# Patient Record
Sex: Male | Born: 1950 | Race: Black or African American | Hispanic: No | Marital: Married | Smoking: Former smoker
Health system: Southern US, Community
[De-identification: ages and names within clinical notes are randomized; demographics above are authoritative.]

## PROBLEM LIST (undated history)

## (undated) DIAGNOSIS — K5190 Ulcerative colitis, unspecified, without complications: Secondary | ICD-10-CM

## (undated) DIAGNOSIS — I10 Essential (primary) hypertension: Secondary | ICD-10-CM

## (undated) DIAGNOSIS — F329 Major depressive disorder, single episode, unspecified: Secondary | ICD-10-CM

## (undated) DIAGNOSIS — D649 Anemia, unspecified: Secondary | ICD-10-CM

## (undated) DIAGNOSIS — M25519 Pain in unspecified shoulder: Secondary | ICD-10-CM

## (undated) DIAGNOSIS — E785 Hyperlipidemia, unspecified: Secondary | ICD-10-CM

## (undated) DIAGNOSIS — G479 Sleep disorder, unspecified: Secondary | ICD-10-CM

## (undated) DIAGNOSIS — T8859XA Other complications of anesthesia, initial encounter: Secondary | ICD-10-CM

## (undated) DIAGNOSIS — F32A Depression, unspecified: Secondary | ICD-10-CM

## (undated) DIAGNOSIS — M1990 Unspecified osteoarthritis, unspecified site: Secondary | ICD-10-CM

## (undated) DIAGNOSIS — M2540 Effusion, unspecified joint: Secondary | ICD-10-CM

## (undated) DIAGNOSIS — H409 Unspecified glaucoma: Secondary | ICD-10-CM

## (undated) DIAGNOSIS — T4145XA Adverse effect of unspecified anesthetic, initial encounter: Secondary | ICD-10-CM

## (undated) DIAGNOSIS — M2550 Pain in unspecified joint: Secondary | ICD-10-CM

## (undated) HISTORY — PX: INGUINAL HERNIA REPAIR: SUR1180

## (undated) HISTORY — PX: TOTAL KNEE ARTHROPLASTY: SHX125

## (undated) HISTORY — PX: ESOPHAGOGASTRODUODENOSCOPY: SHX1529

## (undated) HISTORY — PX: SHOULDER OPEN ROTATOR CUFF REPAIR: SHX2407

## (undated) HISTORY — PX: COLONOSCOPY: SHX174

## (undated) HISTORY — PX: KNEE ARTHROSCOPY: SUR90

---

## 1988-12-06 HISTORY — PX: FOOT SURGERY: SHX648

## 1998-05-02 ENCOUNTER — Other Ambulatory Visit: Admission: RE | Admit: 1998-05-02 | Discharge: 1998-05-02 | Payer: Self-pay | Admitting: Podiatry

## 1999-08-13 ENCOUNTER — Ambulatory Visit (HOSPITAL_COMMUNITY): Admission: RE | Admit: 1999-08-13 | Discharge: 1999-08-13 | Payer: Self-pay | Admitting: Gastroenterology

## 1999-08-13 ENCOUNTER — Encounter (INDEPENDENT_AMBULATORY_CARE_PROVIDER_SITE_OTHER): Payer: Self-pay | Admitting: Specialist

## 2004-05-28 ENCOUNTER — Encounter (INDEPENDENT_AMBULATORY_CARE_PROVIDER_SITE_OTHER): Payer: Self-pay | Admitting: *Deleted

## 2004-05-28 ENCOUNTER — Ambulatory Visit (HOSPITAL_COMMUNITY): Admission: RE | Admit: 2004-05-28 | Discharge: 2004-05-28 | Payer: Self-pay | Admitting: Gastroenterology

## 2007-04-15 ENCOUNTER — Inpatient Hospital Stay (HOSPITAL_COMMUNITY): Admission: EM | Admit: 2007-04-15 | Discharge: 2007-04-18 | Payer: Self-pay | Admitting: Emergency Medicine

## 2007-11-08 ENCOUNTER — Inpatient Hospital Stay (HOSPITAL_COMMUNITY): Admission: AD | Admit: 2007-11-08 | Discharge: 2007-11-17 | Payer: Self-pay | Admitting: Gastroenterology

## 2007-11-12 ENCOUNTER — Encounter (INDEPENDENT_AMBULATORY_CARE_PROVIDER_SITE_OTHER): Payer: Self-pay | Admitting: Gastroenterology

## 2008-11-01 ENCOUNTER — Ambulatory Visit (HOSPITAL_COMMUNITY): Admission: RE | Admit: 2008-11-01 | Discharge: 2008-11-02 | Payer: Self-pay | Admitting: Orthopedic Surgery

## 2009-04-07 HISTORY — PX: CARDIAC CATHETERIZATION: SHX172

## 2010-01-22 ENCOUNTER — Inpatient Hospital Stay (HOSPITAL_COMMUNITY): Admission: AD | Admit: 2010-01-22 | Discharge: 2010-01-30 | Payer: Self-pay | Admitting: Gastroenterology

## 2010-01-23 ENCOUNTER — Encounter (INDEPENDENT_AMBULATORY_CARE_PROVIDER_SITE_OTHER): Payer: Self-pay | Admitting: Gastroenterology

## 2010-01-28 ENCOUNTER — Encounter (INDEPENDENT_AMBULATORY_CARE_PROVIDER_SITE_OTHER): Payer: Self-pay | Admitting: Cardiology

## 2010-02-02 ENCOUNTER — Emergency Department (HOSPITAL_COMMUNITY): Admission: EM | Admit: 2010-02-02 | Discharge: 2010-02-02 | Payer: Self-pay | Admitting: Emergency Medicine

## 2010-06-19 LAB — CARDIAC PANEL(CRET KIN+CKTOT+MB+TROPI)
CK, MB: 1.5 ng/mL (ref 0.3–4.0)
CK, MB: 2 ng/mL (ref 0.3–4.0)
Relative Index: INVALID (ref 0.0–2.5)
Relative Index: INVALID (ref 0.0–2.5)
Total CK: 20 U/L (ref 7–232)
Total CK: 37 U/L (ref 7–232)
Total CK: 43 U/L (ref 7–232)
Troponin I: 0.16 ng/mL — ABNORMAL HIGH (ref 0.00–0.06)

## 2010-06-19 LAB — CBC
HCT: 31.7 % — ABNORMAL LOW (ref 39.0–52.0)
HCT: 35.5 % — ABNORMAL LOW (ref 39.0–52.0)
HCT: 39 % (ref 39.0–52.0)
Hemoglobin: 10.4 g/dL — ABNORMAL LOW (ref 13.0–17.0)
Hemoglobin: 11.5 g/dL — ABNORMAL LOW (ref 13.0–17.0)
Hemoglobin: 13 g/dL (ref 13.0–17.0)
MCH: 27.6 pg (ref 26.0–34.0)
MCHC: 33.3 g/dL (ref 30.0–36.0)
MCV: 85.1 fL (ref 78.0–100.0)
MCV: 85.3 fL (ref 78.0–100.0)
MCV: 86.5 fL (ref 78.0–100.0)
Platelets: 354 10*3/uL (ref 150–400)
Platelets: 412 10*3/uL — ABNORMAL HIGH (ref 150–400)
RBC: 3.63 MIL/uL — ABNORMAL LOW (ref 4.22–5.81)
RBC: 4.17 MIL/uL — ABNORMAL LOW (ref 4.22–5.81)
RDW: 13.9 % (ref 11.5–15.5)
RDW: 14.5 % (ref 11.5–15.5)
WBC: 10.3 10*3/uL (ref 4.0–10.5)
WBC: 10.4 10*3/uL (ref 4.0–10.5)
WBC: 16.1 10*3/uL — ABNORMAL HIGH (ref 4.0–10.5)
WBC: 8.8 10*3/uL (ref 4.0–10.5)

## 2010-06-19 LAB — BLOOD GAS, ARTERIAL
Bicarbonate: 28.1 mEq/L — ABNORMAL HIGH (ref 20.0–24.0)
O2 Content: 2 L/min
O2 Saturation: 97.3 %
Patient temperature: 98.6

## 2010-06-19 LAB — COMPREHENSIVE METABOLIC PANEL
ALT: 199 U/L — ABNORMAL HIGH (ref 0–53)
AST: 16 U/L (ref 0–37)
AST: 86 U/L — ABNORMAL HIGH (ref 0–37)
Alkaline Phosphatase: 96 U/L (ref 39–117)
BUN: 13 mg/dL (ref 6–23)
CO2: 25 mEq/L (ref 19–32)
CO2: 32 mEq/L (ref 19–32)
Calcium: 9.3 mg/dL (ref 8.4–10.5)
Chloride: 100 mEq/L (ref 96–112)
Creatinine, Ser: 0.78 mg/dL (ref 0.4–1.5)
GFR calc Af Amer: >60 mL/min (ref >60)
GFR calc non Af Amer: >60 mL/min (ref >60)
GFR calc non Af Amer: >60 mL/min (ref >60)
Glucose, Bld: 104 mg/dL — ABNORMAL HIGH (ref 70–99)
Glucose, Bld: 155 mg/dL — ABNORMAL HIGH (ref 70–99)
Potassium: 3.9 mEq/L (ref 3.5–5.1)
Sodium: 133 mEq/L — ABNORMAL LOW (ref 135–145)
Total Bilirubin: 0.6 mg/dL (ref 0.3–1.2)

## 2010-06-19 LAB — POCT CARDIAC MARKERS
Myoglobin, poc: 30 ng/mL (ref 12–200)
Myoglobin, poc: 34 ng/mL (ref 12–200)

## 2010-06-19 LAB — CLOSTRIDIUM DIFFICILE EIA

## 2010-06-19 LAB — DIFFERENTIAL
Basophils Relative: 0 % (ref 0–1)
Eosinophils Absolute: 0.1 10*3/uL (ref 0.0–0.7)
Neutrophils Relative %: 73 % (ref 43–77)

## 2010-06-19 LAB — BASIC METABOLIC PANEL
BUN: 12 mg/dL (ref 6–23)
Chloride: 97 mEq/L (ref 96–112)
GFR calc Af Amer: >60 mL/min (ref >60)
GFR calc Af Amer: >60 mL/min (ref >60)
GFR calc non Af Amer: >60 mL/min (ref >60)
Potassium: 3.5 mEq/L (ref 3.5–5.1)
Potassium: 4.3 mEq/L (ref 3.5–5.1)
Sodium: 136 mEq/L (ref 135–145)

## 2010-06-19 LAB — LIPASE, BLOOD: Lipase: 51 U/L (ref 11–59)

## 2010-06-19 LAB — HEPATITIS C ANTIBODY: HCV Ab: NEGATIVE

## 2010-06-19 LAB — HEPATITIS B SURFACE ANTIBODY,QUALITATIVE: Hep B S Ab: NEGATIVE

## 2010-06-19 LAB — STOOL CULTURE

## 2010-07-14 LAB — COMPREHENSIVE METABOLIC PANEL
AST: 19 U/L (ref 0–37)
Alkaline Phosphatase: 57 U/L (ref 39–117)
CO2: 27 mEq/L (ref 19–32)
Chloride: 106 mEq/L (ref 96–112)
Creatinine, Ser: 0.79 mg/dL (ref 0.4–1.5)
GFR calc Af Amer: >60 mL/min (ref >60)
GFR calc non Af Amer: >60 mL/min (ref >60)
Potassium: 3.9 mEq/L (ref 3.5–5.1)
Total Bilirubin: 0.3 mg/dL (ref 0.3–1.2)

## 2010-07-14 LAB — URINALYSIS, ROUTINE W REFLEX MICROSCOPIC
Bilirubin Urine: NEGATIVE
Glucose, UA: NEGATIVE mg/dL
Hgb urine dipstick: NEGATIVE
Ketones, ur: NEGATIVE mg/dL
Protein, ur: NEGATIVE mg/dL
pH: 5.5 (ref 5.0–8.0)

## 2010-07-14 LAB — DIFFERENTIAL
Basophils Absolute: 0 10*3/uL (ref 0.0–0.1)
Basophils Relative: 1 % (ref 0–1)
Eosinophils Absolute: 0.1 10*3/uL (ref 0.0–0.7)
Eosinophils Relative: 4 % (ref 0–5)
Lymphocytes Relative: 29 % (ref 12–46)

## 2010-07-14 LAB — CBC
HCT: 41.5 % (ref 39.0–52.0)
MCV: 85 fL (ref 78.0–100.0)
RBC: 4.88 MIL/uL (ref 4.22–5.81)
WBC: 4.1 10*3/uL (ref 4.0–10.5)

## 2010-07-14 LAB — PROTIME-INR: Prothrombin Time: 13.6 seconds (ref 11.6–15.2)

## 2010-08-20 NOTE — H&P (Signed)
NAME:  [name], [name] NO.:  [id number]   MEDICAL RECORD NO.:  [id number]          PATIENT TYPE:  INP   LOCATION:  5524                         FACILITY:  [facility]   PHYSICIAN:  Shirley Friar, MDDATE OF BIRTH:  Sep 06, 1950   DATE OF ADMISSION:  11/08/2007  DATE OF DISCHARGE:                              HISTORY & PHYSICAL   HISTORY OF PRESENT ILLNESS:  This is a 60 year old gentleman known as  Levi Turner who has a history of ulcerative colitis and a recent history of C.  difficile colitis in January and March 2009.  He was C. diff negative on  July 27.  He is complaining of vomiting for the past three weeks,  bloody, mucusy stools that have increased in frequency to 20-25 times  per day.  He also had painful, thrombosed hemorrhoids that appeared this  past Wednesday, the 27th.  The patient reports feeling extremely drained  and diaphoretic.  He had severe cramping across his lower abdomen with  each bowel movement.  He has not been on recent antibiotics.  He has  been on 4.8 g of Lialda daily along with 20 mg of prednisone daily.  His  last hospitalization was in January 2009 for same complicated by C.  diff.   PAST MEDICAL HISTORY:  Significant for ulcerative proctitis,  hypertension, high cholesterol.   PRIMARY GASTROENTEROLOGIST:  Everardo All. Madilyn Fireman, MD   PRIMARY CARE PHYSICIAN:  Chales Salmon. Abigail Miyamoto, MD   His last endoscopy/colonoscopy was in January 2009.  His endoscopy was  normal:  Anoscopy demonstrated left-sided ulcerative colitis.  Copy of  this report is on the chart.   The only surgery he has had was a hernia repair.  He reports that he had  difficulty waking up from the anesthesia.   CURRENT MEDICATIONS:  Lisinopril, Canasa suppositories, Lialda, Estric,  B complex, glucosamine, calcium, cod liver oil, red rice yeast,  prednisone 40 mg daily.   He has no known drug allergies.   REVIEW OF SYSTEMS:  Significant for low-grade fever.  No shortness of  breath or palpitations.  He does have of hoarse voice he tells me.   SOCIAL HISTORY:  Negative for alcohol or tobacco.  He is married.  He is  a Hydrographic surveyor in Education officer, environmental.   His mother passed of lung cancer.  His father passed with an MI.   PHYSICAL EXAMINATION:  He is an extremely pleasant man.  His wife is  also present.  He appears fatigued.  Heart has a regular rate and  rhythm.  He has no murmurs, rubs, or gallops.  Lungs are clear to  auscultation bilaterally.  Abdomen is soft, nontender, and nondistended  with increased bowel sounds.  On rectal exam, he has two hemorrhoids  that are somewhat thrombosed that are extremely tender.  His anal  sphincter is tight and I was unable to conduct a rectal exam secondary  to his pain.  Labs are pending.   ASSESSMENT:  Dr. Charlott Rakes has seen and examined the patient,  collected his history, and reviewed his chart.  His impression is this  is a  60 year old gentleman with left-sided ulcerative colitis who was in  his usual state of health until several weeks ago, when he developed  profuse bloody diarrhea, chills, subjective fevers for several weeks  despite a prednisone taper.  He was on Rowasa suppositories and lay out  4.8 g per day.  Last Wednesday, on July 29 he developed severe rectal  pain and burning and his wife noticed his hemorrhoids.  His abdomen is  diffuse, tender, soft, nondistended with good bowel sounds.  Rectum  shows two thrombosed external hemorrhoids that are tender.   PLAN:  IV Flagyl, IV Solu-Medrol, hydration, KUB, Surgery consult.  Thanks very much.      Stephani Police, [place]      Shirley Friar, MD  Electronically Signed    MLY/MEDQ  D:  11/08/2007  T:  11/09/2007  Job:  [id number]   cc:   Chales Salmon. Abigail Miyamoto, M.D.  John C. Madilyn Fireman, M.D.

## 2010-08-20 NOTE — Op Note (Signed)
NAME:  Levi Turner, Levi Turner NO.:  [id number]   MEDICAL RECORD NO.:  [id number]          PATIENT TYPE:  INP   LOCATION:  5529                         FACILITY:  [facility]   PHYSICIAN:  Shirley Friar, MDDATE OF BIRTH:  1950-07-03   DATE OF PROCEDURE:  DATE OF DISCHARGE:  09/18/2007                               OPERATIVE REPORT   INDICATIONS:  Ulcerative colitis, recent diagnosis of C. diff colitis,  and persistent bloody diarrhea.   MEDICATIONS:  Fentanyl 75 mcg IV and Versed 7.5 mg IV.   FINDINGS:  Rectal exam was unremarkable.  A pediatric Pentax colonoscope  was inserted into an unprepped colon which revealed diffuse edema,  erythema, and ulcers consistent with severe ulcerative colitis.  Scattered white exudate was also seen throughout the colon.  The sigmoid  was very edematous and ulcerated with easy friability.  The colonoscope  was stopped at 40 cm, and random biopsies were taken from 14 cm to the  rectum.  Retroflexion was not attempted due to severe ulceration and  edema in the rectum.   ASSESSMENT:  1. Severe ulcerative colitis in sigmoid to rectum.  Colonoscope was      not advanced proximal to 40 cm due to severe ulceration and      friability.  2. Status post biopsies of colon to check for Cytomegalovirus in the      setting of ulcerative colitis and C. diff colitis.   PLAN:  1. Resume Lialda and start Rowasa enemas.  Continue IV Solu-Medrol and      Flagyl IV.  2. Followup on pathology.       Shirley Friar, MD  Electronically Signed     VCS/MEDQ  D:  11/12/2007  T:  11/13/2007  Job:  [id number]   cc:   Everardo All. Madilyn Fireman, M.D.

## 2010-08-20 NOTE — H&P (Signed)
NAME:  Levi Turner, Levi Turner NO.:  [id number]   MEDICAL RECORD NO.:  [id number]          PATIENT TYPE:  EMS   LOCATION:  MAJO                         FACILITY:  [facility]   PHYSICIAN:  John C. Madilyn Fireman, M.D.    DATE OF BIRTH:  [birth year]   DATE OF ADMISSION:  DATE OF DISCHARGE:                              HISTORY & PHYSICAL   CHIEF COMPLAINT:  Bloody diarrhea.   HISTORY OF ILLNESS:  The patient is a 60 year old black male who is  admitted for inpatient care of ulcerative colitis and possibly  pseudomembranous colitis.  He has had ulcerative proctitis initially  diagnosed in 2001 and this has been fairly well-behaved and limited to  the rectum most of this time, and generally treated with Canasa  suppositories.  On a colonoscopy, he had fairly intense proctitis to 12  cm in February 2006 and then in October 2008 fairly intense  proctocolitis at 25 cm.  He was more symptomatic than usual at that time  with bloody diarrhea and feeling poorly.  We started him on a tapered  course of prednisone as well as Lialda 4.8 grams a day in addition to  his Canasa suppositories when he could take them.  He eventually  improved and weaned his prednisone down  to 5 mg a day on April 05, 2007.  Shortly thereafter, he noticed some increase in diarrhea which  was watery rather than usual bloody diarrhea.  This progressed with  general fatigue and weakness.  He was seen again on April 13, 2007  having lost 8 pounds from the previous visit on April 05, 2007.   It was noted that he had been given within the last 3-4 weeks a course  of Augmentin for a sinus infection.  We obtained a C. difficile toxin  which he took to the lab yesterday morning.  However, his daughter  called today stating he was weaker and having some difficulty walking  easily, and was having more blood in his stools again.  I had him come  to over the endoscopy unit.  We did an unprepped partial colonoscopy to  about 70  cm.  This showed severe ulcerative colitis with up to about 60  cm.  I could not rule out superimposed pseudomembranes completely.  He  is admitted for IV corticosteroids, fluids and initially antibiotics to  cover C. difficile pending C. difficile results.  He has BMET and CBC  ordered today that are pending.   PAST MEDICAL HISTORY:  Other than the above, hypertension.   PAST SURGICAL HISTORY:  Hernia repair in 1960.   MEDICATIONS:  1. Lisinopril  20 mg a day.  2. Canasa suppositories 1 q.p.m.  3. Lialda 4.8 gm a day.  4. B complex vitamins.  5. Estra-C.  6. Saw palmetto.  7. Glucosamine.  8. Cod liver oil.  9. Iron.   SOCIAL HISTORY:  The patient is a Hydrographic surveyor.  He is married.  He denies alcohol or tobacco use.   FAMILY HISTORY:  Mother died of lung cancer.  Father died of an MI.   ALLERGIES:  NONE  KNOWN.   PHYSICAL EXAMINATION:  GENERAL:  Thin, pleasant black male in no acute  distress.  VITAL SIGNS:  Blood pressure 120/80 lying and 115/75 standing.  Pulse  rises from 114-142.   IMPRESSION:  Severe ulcerative colitis, cannot rule out superimposed  pseudomembranous colitis.   PLAN:  We will admit for IV fluids, begin corticosteroids and IV Flagyl  pending receipt of his C.  difficile toxin assay.           ______________________________  Everardo All Madilyn Fireman, M.D.     JCH/MEDQ  D:  04/15/2007  T:  04/15/2007  Job:  [id number]   cc:   Chales Salmon. Abigail Miyamoto, M.D.

## 2010-08-20 NOTE — Discharge Summary (Signed)
NAME:  Levi Turner, Levi Turner NO.:  [id number]   MEDICAL RECORD NO.:  [id number]          PATIENT TYPE:  INP   LOCATION:  5529                         FACILITY:  [facility]   PHYSICIAN:  John C. Madilyn Fireman, M.D.    DATE OF BIRTH:  [birth year]   DATE OF ADMISSION:  11/08/2007  DATE OF DISCHARGE:  11/17/2007                               DISCHARGE SUMMARY   Levi Turner was admitted to [facility] on November 08 2007, he  was discharged on November 17, 2007.   Discharge diagnoses include:  1. Ulcerative colitis flare.  2. Hemorrhoids.  3. C. difficile positive.  4. Hypertension.  5. High cholesterol.   PROCEDURES:  On November 12, 2007, he had a flexible sigmoidoscopy by Dr.  Charlott Rakes.  The flex sig went to 40 cm.  Results showed severe  ulcerative colitis in the sigmoid to the rectum.  The scope was not  advanced past 40 cm secondary to friability and ulceration.  The patient  was biopsied for Cytomegalovirus, and biopsies were negative for  Cytomegalovirus.  Biopsy showed only severe ulcerative colitis with no  viral cytopathic changes.   Consults included [facility] for hemorrhoids.  The patient  was seen by Dr. Corliss Skains on August 3,2009.   No surgical intervention was required.   ProctoFoam and tucks pads were recommended.   H&P AND BRIEF HOSPITAL COURSE:  Levi Turner is a very pleasant 60-  year-old [place] American male who is a patient of Dr. Dorena Cookey.  He  has been diagnosed with ulcerative proctitis.  He also had a recent  history of C. difficile colitis in January and again in March 2009.  He  is complaining of vomiting for the past 3 weeks as well as bloody  mucousy stools have increased in frequency to 20-25 times a day.  On  admission, he also had painful thrombosed hemorrhoids that appeared on  Wednesday, November 01, 2007, and prevented him from being able to use his  Canasa suppositories.  The patient reported feeling extremely  drained  and diaphoretic.  He had severe cramping across his lower abdomen was  each bowel movement, but reported that he has not been on recent  antibiotics.  He was being treated as an outpatient with 4.8 g of Lialda  and 20 mg of prednisone daily.  On admission, an abdominal x-ray was  taken to rule out toxic megacolon.  The x-ray was negative.  On  admission, the patient was given IV fluids as well as IV Solu-Medrol and  Flagyl.  Stool studies were taken.  He was C. diff positive x1 and C.  diff negative x2.  He was placed on contact precautions and started on  Flora-Q in addition to his Flagyl.  Over the course of the next couple  of days, the patient's pain decreased, but he still had between 8-15  bloody stools a day and was complaining of nausea on Friday, November 12, 2007.  He had a flexible sigmoidoscopy done by Dr. Bosie Clos as noted  above.  Slowly as the patient began to improve.  His  diet was advanced  to full liquids.  He did develop what appeared to be thrush in his mouth  and was started on nystatin.  Over the course of the next few days, his  nausea and pain resolved.  His bloody stools finally stopped on November 16, 2007, and he was advanced to a full diet and changed from IV Solu-  Medrol to oral prednisone today on November 17, 2007.  He was without  pain, without any blood in his bowel movements, without nausea or  vomiting, and is ambulating around the room when I saw him.  He was  tolerating a full diet well and told me he felt like he was ready to go  home.  He did say, however, that he nystatin could not change what we  thought was thrush in his mouth, so on discharge I wrote him a  prescription for Diflucan 100 mg one pill a day for 2 weeks, also gave  him a prescription to continue his oral Flagyl 500 mg one pill four  times a day.  He had a ProctoFoam from the hospital to last him another  several weeks.  Other than that, he was going to resume his preadmission   medications which were as follows:  Simvastatin 80 mg daily, lisinopril  20 mg daily, Canasa suppositories 1000 mg per rectum nightly, Lialda 4.8  g daily, prednisone 20 mg b.i.d., Super B-Complex, Ester-C, saw  palmetto, glucosamine, iron, multivitamin, and calcium plus D.  He has a  followup appointment on November 24, 2007, with Dr. Dorena Cookey in the  office.   Followup instructions included advancing his diet slowly, waiting to  return to work until after he was seen by Dr. Madilyn Fireman, calling the [facility]  GI office with fevers of over 100 degrees, and increasing amounts of  pain or bloody stools.      Stephani Police, PA    ______________________________  Everardo All Madilyn Fireman, M.D.    MLY/MEDQ  D:  11/17/2007  T:  11/18/2007  Job:  [id number]   cc:   Everardo All. Madilyn Fireman, M.D.  Chales Salmon. Abigail Miyamoto, M.D.

## 2010-08-20 NOTE — Discharge Summary (Signed)
NAME:  Levi Turner, Levi Turner NO.:  [id number]   MEDICAL RECORD NO.:  [id number]          PATIENT TYPE:  INP   LOCATION:  3039                         FACILITY:  [facility]   PHYSICIAN:  Petra Kuba, M.D.    DATE OF BIRTH:  01-20-1951   DATE OF ADMISSION:  04/15/2007  DATE OF DISCHARGE:  04/18/2007                               DISCHARGE SUMMARY   DISCHARGE DIAGNOSES:  1. Clostridium difficile.  2. Ulcerative colitis.  3. High blood pressure.   HISTORY:  The patient was admitted by Dr. Madilyn Fireman for increased diarrhea  in a patient with ulcerative colitis.  He did have some pseudomembranes  on a flex-sig, and a C. diff at the hospital came back positive.  He was  started both on Solu-Medrol and Flagyl and improved nicely.  Dr.  Bosie Clos took care of him in the hospital for one day, and I saw him on  the 10th where he was much better.  We advanced his diet, changed him to  p.o. medicine, and on the 11th he was ready for discharge.   DIET:  Avoid foods that make him worse, care with fried, spicy, nuts,  seeds, popcorn, and vegetables.   WOUND CARE:  Not applicable.   Probably can go to work in a few days.  I put Wednesday, January 14th,  on his paperwork.   ACTIVITY:  No restrictions.   SPECIAL INSTRUCTIONS:  Call if increased fever, diarrhea, bleeding,  pain, or increased nausea or vomiting.  He does have an appointment with  Dr. Madilyn Fireman coming up, call sooner p.r.n.  I believe it is in nine days to  see Dr. Madilyn Fireman.   MEDICINES:  Resume Lisinopril, Lialda, multivitamins, and iron.  We will  go ahead and continue Flagyl for probably two weeks 500 t.i.d., Dr.  Madilyn Fireman can adjust how to wean it, and we will also fairly quickly wean  his prednisone dropping him to 10 mg for three days at home and then 5 a  day at home.  Prescriptions were written.   CONDITION ON DISCHARGE:  Improved.           ______________________________  Petra Kuba, M.D.     MEM/MEDQ  D:   04/18/2007  T:  04/18/2007  Job:  [id number]   cc:   Everardo All. Madilyn Fireman, M.D.  Chales Salmon. Abigail Miyamoto, M.D.

## 2010-08-20 NOTE — Op Note (Signed)
NAME:  Levi Turner, Levi Turner NO.:  [id number]   MEDICAL RECORD NO.:  [id number]          PATIENT TYPE:  AMB   LOCATION:  DAY                          [facility]ACILITY[facility]  [facility]   PHYSICIAN:  Georges Lynch. Gioffre, M.D.DATE OF BIRTH:  [birth year]   DATE OF PROCEDURE:  11/01/2008  DATE OF DISCHARGE:                               OPERATIVE REPORT   SURGEON:  Georges Lynch. [name], M.D.   ASSISTANT:  Nurse.   PREOPERATIVE DIAGNOSES:  1. 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  2. Severe impingement syndrome from the subacromial space.  3. Torn rotator cuff tendon left shoulder.   POSTOPERATIVE DIAGNOSES:  1. 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  2. Severe impingement syndrome from the subacromial space.  3. Torn rotator cuff tendon left shoulder.   OPERATION:  1. Open repair of a hole in the rotator cuff tendon, left shoulder.  2. Partial acromionectomy and acromioplasty.  3. Resection distal clavicle.   DESCRIPTION OF PROCEDURE:  Under general anesthesia a routine orthopedic  prep and draping of the left shoulder was carried out.  He had 1 gram of  IV Ancef.  The routine time-out was carried out.  At this time an  incision was made over the [facility] joint and extended distally.  I stripped  the deltoid tendon from the acromion and opened the [facility] joint.  He had  severe arthritic changes as shown on the MRI.  I first opened the  proximal part of the deltoid muscle.  I resected a subdeltoid bursa.  He  had a hole in the tendon from the acromion impingement.  I protected the  underlying cuff with a Bennett retractor.  I did a partial  acromionectomy and acromioplasty utilizing oscillating saw and bur.  I  then went over and directed attention to the [facility] joint.  I resected the  distal clavicle in the usual fashion by making an oblique resection cut.  Great care was taken not to injure underlying vessels.  Following that  we utilized the bur to even the undersurface of the clavicle.  I then  rechecked the subacromial space.  We had good re-establishment of the  subacromial space.  Thoroughly irrigated out the area, repaired the  rotator cuff tendon with #1 Ethibond suture.  The deltoid tendon and  muscle was reapproximated in the usual fashion.  Subcu was closed with 0  Vicryl and skin with metal staples.  Sterile Neosporin dressing was  applied and he was placed in a shoulder immobilizer.           ______________________________  Georges Lynch [name], M.D.    RAG/MEDQ  D:  11/01/2008  T:  11/01/2008  Job:  [id number]

## 2010-08-20 NOTE — Consult Note (Signed)
NAME:  Levi Turner, Levi Turner NO.:  [id number]   MEDICAL RECORD NO.:  [id number]          PATIENT TYPE:  OIB   LOCATION:  1307                         FACILITY:  [facility]   PHYSICIAN:  Georges Lynch. Gioffre, M.D.DATE OF BIRTH:  [birth year]   DATE OF CONSULTATION:  11/02/2008  DATE OF DISCHARGE:                                 CONSULTATION   HISTORY OF PRESENT ILLNESS:  He was brought into the hospital yesterday  on November 01, 2008 and taken to surgery for repair of a torn rotator cuff  tendon on the left and resection of his distal clavicle on the left.  Postop day 1, rounds were made on November 02, 2008. He was doing fine.  He  is afebrile with good function in his hand and I elected to discharge  him.   LABORATORY DATA:  When he came in his white count was 4100, hemoglobin  13.9, hematocrit 41.5, platelet count 246,000 with a normal  differential.  The sodium was 141, potassium 3.9, chloride 106, glucose  96, BUN 10, creatinine 0.79.  His alkaline phos was normal at 57.  SGOT,  SGPT were normal.  His SGOT was 19, SGPT 18.  His INR was 1.0.  The PTT  was 27.  The urinalysis was within normal limits.   His EKG was normal.   FINAL DISCHARGE DIAGNOSES:  1. Torn rotator cuff tendon, left.  2. Chromium clavicular joint arthritis, left.   CONDITION ON DISCHARGE:  Improved.   DISCHARGE MEDICATIONS:  1. He will remain on his preop medications.  2. He will be on aspirin 325 mg b.i.d. for a week as an anticoagulant      at home.  3. He was on Prinivil 20 mg.  4. He will be on Percocet 10/650 one every 4 hours p.r.n. for pain.  5. Robaxin 500 mg t.i.d. p.r.n. for spasms.  6. Lialda 1.2 grams 4 capsules each a.m.  7. Simvastatin 80 mg a day.  8. Lisinopril 20 mg a day.  9. Estrace 500 mg a day.  10.He is on multiple vitamins at home. He can stay on those.   ACTIVITY:  He will ambulate as we explained with weightbearing.   FOLLOWUP:  I will see him in the office in 2 weeks or prior  to that if  there is a problem.          ______________________________  Georges Lynch [name], M.D.    RAG/MEDQ  D:  11/02/2008  T:  11/02/2008  Job:  [id number]

## 2010-08-20 NOTE — Consult Note (Signed)
NAME:  Levi Turner, Levi Turner NO.:  [id number]   MEDICAL RECORD NO.:  [id number]          PATIENT TYPE:  INP   LOCATION:  5524                         FACILITY:  [facility]   PHYSICIAN:  Wilmon Arms. Corliss Skains, M.D. DATE OF BIRTH:  1951/03/29   DATE OF CONSULTATION:  DATE OF DISCHARGE:                                 CONSULTATION   REQUESTING PHYSICIAN:  Levi Friar, MD   REASON FOR CONSULTATION:  Hemorrhoids questionably thrombosed.   HISTORY OF PRESENT ILLNESS:  Levi Turner is a 60 year old male  patient who was admitted to the hospital today because of an ulcerative  colitis flare.  He has known ulcerative proctitis.  He began having  rectal pain about 3-5 days ago and noticed he had hemorrhoids.  He has  never had hemorrhoids before and then never had this kind of issue in  regards to his proctitis flares.  Over-the-counter, he has been using  Analpram and Tucks Pads without any relief.  The patient has  subsequently been admitted and started on steroids for the proctitis  flare and surgical consultation has been requested to evaluate  hemorrhoids.  There was some concern that they may be thrombosed.   REVIEW OF SYSTEMS:  As above.  No draining, no bleeding from the area  prior to admission.  Rectal pain that is constant that increases with  defecation.   PAST MEDICAL HISTORY:  1. Ulcerative colitis/proctitis.  2. Hypertension.  3. Dyslipidemia.   PAST SURGICAL HISTORY:  Hernia repair.   ALLERGIES:  NKDA.   HOME MEDICATIONS:  Simvastatin, lisinopril, Canasa suppository, Aster C,  Super B vitamin with vitamin C, Lialda, prednisone daily, Saw Palmetto,  glucosamine, calcium, iron, and multiple vitamins.   PHYSICAL EXAMINATION:  GENERAL:  Pleasant male patient complaining of  significant rectal discomfort.  VITAL SIGNS:  Temperature 98.1, BP 122/76, pulse 88, and respirations  20.  CHEST:  Bilateral lung sounds are clear to auscultation anterior.  He is  on room air sating 97%.  CARDIOVASCULAR:  Heart sounds S1 and S2 with no rubs, murmurs, or  gallops.  No JVD.  No peripheral edema.  ABDOMEN:  Soft, nontender, nondistended without hepatosplenomegaly and  masses or bruits noted.  RECTAL:  Two large hemorrhoids, one at 1 o'clock, another at 3 o'clock,  the one at 3 o'clock.  The base appears to be somewhat thrombosed,  although the hemorrhoids are very tender, they are very spongy and  almost clear in appearance consistent with edema.  He has several other  rectal tags, a smaller hemorrhoids due to examining in the bed and not  having an endoscope unable to determine if the patient has underlying  fissures.  EXTREMITIES:  Symmetrical in appearance without cyanosis or clubbing.   LABORATORY DATA:  Pending.   IMPRESSION:  1. Large hemorrhoids x2 questionable whether #2 at 3 o'clock is      thrombosed.  2. Ulcerative colitis flare.   PLAN:  1. Start ProctoFoam PR q.i.d. as well as sitz baths q.i.d.  2. We will have a I&D tray at the bedside.  We may or may not need  to      drain these hemorrhoids this admission, especially if they truly      are thrombosed and/or they do not respond to medical therapy.      Allison L. Rondel Jumbo. Tsuei, M.D.  Electronically Signed    ALE/MEDQ  D:  11/08/2007  T:  11/09/2007  Job:  [id number]

## 2010-08-23 NOTE — Op Note (Signed)
NAME:  Levi Turner, Levi Turner NO.:  [id number]   MEDICAL RECORD NO.:  [id number]          PATIENT TYPE:  AMB   LOCATION:  ENDO                         FACILITY:  [facility]   PHYSICIAN:  John C. Madilyn Fireman, M.D.    DATE OF BIRTH:  1951/02/05   DATE OF PROCEDURE:  05/28/2004  DATE OF DISCHARGE:                                 OPERATIVE REPORT   PROCEDURE:  Colonoscopy with biopsy.   INDICATIONS FOR PROCEDURE:  The patient with ulcerative colitis with recent  flare.  Last colonoscopy 5 years ago.  He has not responded as well to  Canasa suppositories as he did on his initial presentation 5 years ago.   PROCEDURE:  The patient was placed in the left lateral decubitus position  and placed on the pulse monitor with continuous low-flow oxygen delivered by  nasal cannula. He was sedated with 50 mcg IV fentanyl and 6 milligrams IV  Versed. Olympus video colonoscope was inserted into the rectum and advanced  to cecum, confirmed by transillumination of McBurney's point and  visualization of ileocecal valve and appendiceal orifice.  The prep was  excellent. The cecum, ascending transverse, descending and sigmoid colon all  appeared normal with no masses, polyps, diverticula or other mucosal  abnormalities. In the rectum, there was an intense ring of erythema and  granularity, contact friability, and edema consistent with proctitis. It was  circumferential and extended from about 12 cm to the anus to about 3 cm from  the anus, somewhat unusual in that the distal 3 cm or so appeared relatively  uninflamed.  Biopsies were taken.  Scope was then withdrawn and the patient  returned to the recovery room in stable condition. He tolerated the  procedure well, and there were no immediate complications.   IMPRESSION:  Intense proctitis from about 3 to about 12 cm from the anal  verge.   PLAN:  Await biopsy results and if ulcerative colitis, suggest will probably  begin Rowasa enemas at bedtime  alternating with Canasa suppositories in the  morning.      JCH/MEDQ  D:  05/28/2004  T:  05/28/2004  Job:  [id number]

## 2010-08-23 NOTE — Procedures (Signed)
[place]. [facility]  Patient:    Levi Turner, Levi Turner                   MRN: [id number] Proc. Date: 08/13/99 Adm. Date:  [id number] Attending:  Louie Bun CC:         Jamesetta Geralds, M.D.                           Procedure Report  PROCEDURE: Colonoscopy with biopsy.  ENDOSCOPIST: Everardo All. Madilyn Fireman, M.D.  INDICATIONS FOR PROCEDURE: Intermittent rectal bleeding in a 60 year old patient with no prior colon screening.  DESCRIPTION OF PROCEDURE: The patient was placed in the left lateral decubitus position and placed on the pulse monitor, with continuous low-flow oxygen delivered via nasal cannula.  He was sedated with 35 mg IV Demerol and 5 mg IV Versed.  The Olympus video colonoscope was inserted into the rectum and advanced to the cecum, confirmed by transillumination of McBurneys point and visualization of the ileocecal valve and appendiceal orifice.  The prep was adequate but somewhat suboptimal proximally, with a moderate amount of semi-solid and nontransparent liquid stool requiring vigorous lavage.  Some of the nontransparent stool remained adherent to the walls and I could thus not rule out small lesions less than 1 cm in all areas.  Otherwise, the cecum, transverse, descending, and sigmoid colon all appeared normal with no masses, polyps, diverticula, or other mucosal abnormalities.  The proximal rectum appeared normal but the distal rectum from 7 cm down to the anal verge was involved with a diffuse moderate proctitis with erythema, granularity, friability, and a few very small aphthous ulcers.  Biopsies were taken.  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  The colonoscope was then withdrawn and the patient returned to the recovery room in stable condition. He tolerated the procedure well and there were no immediate complications.  IMPRESSION: Proctitis involving the distal 6 cm of the rectum, otherwise normal  colonoscopy.  PLAN: Await histology, and will probably begin Rowasa suppositories. DD:  08/13/99 TD:  08/13/99 Job: [id number]

## 2010-09-23 ENCOUNTER — Encounter (HOSPITAL_COMMUNITY)
Admission: RE | Admit: 2010-09-23 | Discharge: 2010-09-23 | Disposition: A | Discharge status: Home / Self Care | Payer: PRIVATE HEALTH INSURANCE | Source: Ambulatory Visit | Attending: Gastroenterology | Admitting: Gastroenterology

## 2010-09-23 DIAGNOSIS — K5150 Left sided colitis without complications: Secondary | ICD-10-CM | POA: Insufficient documentation

## 2010-10-07 ENCOUNTER — Encounter (HOSPITAL_COMMUNITY): Payer: PRIVATE HEALTH INSURANCE | Attending: Gastroenterology

## 2010-10-07 ENCOUNTER — Encounter (HOSPITAL_COMMUNITY): Payer: PRIVATE HEALTH INSURANCE

## 2010-10-07 DIAGNOSIS — K5150 Left sided colitis without complications: Secondary | ICD-10-CM | POA: Insufficient documentation

## 2010-11-04 ENCOUNTER — Encounter (HOSPITAL_COMMUNITY): Payer: PRIVATE HEALTH INSURANCE

## 2010-12-26 LAB — CBC
HCT: 34.5 — ABNORMAL LOW
HCT: 35.9 — ABNORMAL LOW
Hemoglobin: 11.3 — ABNORMAL LOW
Hemoglobin: 11.8 — ABNORMAL LOW
Hemoglobin: 11.9 — ABNORMAL LOW
MCHC: 32.7
MCHC: 32.9
MCHC: 33.3
MCV: 81.8
RBC: 4.22
RBC: 4.35
RBC: 4.44
RDW: 15.6 — ABNORMAL HIGH
RDW: 16 — ABNORMAL HIGH
WBC: 11.5 — ABNORMAL HIGH

## 2010-12-26 LAB — DIFFERENTIAL
Basophils Absolute: 0
Basophils Relative: 0
Basophils Relative: 0
Eosinophils Absolute: 0
Eosinophils Relative: 0
Lymphs Abs: 0.5 — ABNORMAL LOW
Monocytes Absolute: 1.2 — ABNORMAL HIGH
Monocytes Absolute: 1.6 — ABNORMAL HIGH
Monocytes Relative: 11
Monocytes Relative: 16 — ABNORMAL HIGH
Neutro Abs: 9.8 — ABNORMAL HIGH
Neutrophils Relative %: 85 — ABNORMAL HIGH

## 2010-12-26 LAB — COMPREHENSIVE METABOLIC PANEL
ALT: 19
Alkaline Phosphatase: 62
BUN: 5 — ABNORMAL LOW
CO2: 28
Calcium: 9.2
GFR calc non Af Amer: >60
Glucose, Bld: 178 — ABNORMAL HIGH
Total Protein: 6.1

## 2010-12-26 LAB — BASIC METABOLIC PANEL
CO2: 29
Calcium: 9.2
Chloride: 103
Creatinine, Ser: 0.7
GFR calc Af Amer: >60
Sodium: 137

## 2011-01-03 LAB — BASIC METABOLIC PANEL
BUN: 7
BUN: 7
BUN: 8
BUN: 9
CO2: 31
Calcium: 9
Calcium: 9
Chloride: 100
Chloride: 101
Chloride: 99
Creatinine, Ser: 0.79
Creatinine, Ser: 0.9
GFR calc Af Amer: >60
GFR calc non Af Amer: >60
GFR calc non Af Amer: >60
GFR calc non Af Amer: >60
GFR calc non Af Amer: >60
GFR calc non Af Amer: >60
GFR calc non Af Amer: >60
Glucose, Bld: 117 — ABNORMAL HIGH
Glucose, Bld: 128 — ABNORMAL HIGH
Glucose, Bld: 138 — ABNORMAL HIGH
Glucose, Bld: 143 — ABNORMAL HIGH
Glucose, Bld: 147 — ABNORMAL HIGH
Glucose, Bld: 165 — ABNORMAL HIGH
Potassium: 3.6
Potassium: 3.9
Potassium: 4
Potassium: 4.1
Potassium: 4.5
Sodium: 136
Sodium: 137
Sodium: 138

## 2011-01-03 LAB — CBC
HCT: 37.7 — ABNORMAL LOW
HCT: 38.2 — ABNORMAL LOW
HCT: 39.8
HCT: 41.4
HCT: 42
Hemoglobin: 12.6 — ABNORMAL LOW
Hemoglobin: 13
Hemoglobin: 13.2
Hemoglobin: 13.8
Hemoglobin: 14
MCHC: 33.3
MCHC: 33.7
MCV: 85.8
MCV: 85.8
MCV: 86.6
MCV: 86.9
MCV: 87
Platelets: 344
Platelets: 350
Platelets: 361
Platelets: 411 — ABNORMAL HIGH
RBC: 4.5
RBC: 4.84
RDW: 13.7
RDW: 13.8
RDW: 13.9
RDW: 13.9
RDW: 14.2
RDW: 14.2
WBC: 10.3
WBC: 12.8 — ABNORMAL HIGH
WBC: 4.9
WBC: 6.3
WBC: 6.9
WBC: 8

## 2011-01-03 LAB — CLOSTRIDIUM DIFFICILE EIA
C difficile Toxins A+B, EIA: 5
C difficile Toxins A+B, EIA: NEGATIVE
C difficile Toxins A+B, EIA: NEGATIVE
C difficile Toxins A+B, EIA: NEGATIVE

## 2011-01-03 LAB — STOOL CULTURE

## 2011-01-03 LAB — HEPATIC FUNCTION PANEL
AST: 10
Bilirubin, Direct: <0.1
Total Bilirubin: 0.5

## 2011-01-03 LAB — EHEC TOXIN BY EIA, STOOL: EHEC Toxin by EIA: NEGATIVE

## 2011-01-06 ENCOUNTER — Encounter (HOSPITAL_COMMUNITY): Payer: PRIVATE HEALTH INSURANCE | Attending: Gastroenterology

## 2011-01-06 DIAGNOSIS — K5150 Left sided colitis without complications: Secondary | ICD-10-CM | POA: Insufficient documentation

## 2011-02-05 ENCOUNTER — Encounter (HOSPITAL_COMMUNITY): Payer: PRIVATE HEALTH INSURANCE

## 2011-02-25 ENCOUNTER — Other Ambulatory Visit (HOSPITAL_COMMUNITY): Payer: Self-pay | Admitting: *Deleted

## 2011-03-03 ENCOUNTER — Encounter (HOSPITAL_COMMUNITY)
Admission: RE | Admit: 2011-03-03 | Discharge: 2011-03-03 | Disposition: A | Discharge status: Home / Self Care | Payer: PRIVATE HEALTH INSURANCE | Source: Ambulatory Visit | Attending: Gastroenterology | Admitting: Gastroenterology

## 2011-03-03 DIAGNOSIS — K5150 Left sided colitis without complications: Secondary | ICD-10-CM | POA: Insufficient documentation

## 2011-03-03 MED ORDER — SODIUM CHLORIDE 0.9 % IV SOLN
400.0000 mg | INTRAVENOUS | Status: AC
  Administered 2011-03-03: 400 mg via INTRAVENOUS
  Filled 2011-03-03: qty 40

## 2011-03-03 MED ORDER — ACETAMINOPHEN 325 MG PO TABS: 650.0000 mg | ORAL_TABLET | ORAL | Status: DC

## 2011-03-03 MED ORDER — SODIUM CHLORIDE 0.9 % IV SOLN: 400.0000 mg | INTRAVENOUS | Status: DC

## 2011-03-03 MED ORDER — DIPHENHYDRAMINE HCL 25 MG PO TABS
25.0000 mg | ORAL_TABLET | ORAL | Status: DC
  Filled 2011-03-03: qty 1

## 2011-04-28 ENCOUNTER — Encounter (HOSPITAL_COMMUNITY)
Admission: RE | Admit: 2011-04-28 | Discharge: 2011-04-28 | Disposition: A | Discharge status: Home / Self Care | Payer: BC Managed Care – PPO | Source: Ambulatory Visit | Attending: Gastroenterology | Admitting: Gastroenterology

## 2011-04-28 DIAGNOSIS — K5150 Left sided colitis without complications: Secondary | ICD-10-CM | POA: Insufficient documentation

## 2011-04-28 MED ORDER — INFLIXIMAB 100 MG IV SOLR
5.0000 mg/kg | Freq: Once | INTRAVENOUS | Status: AC
  Administered 2011-04-28: 400 mg via INTRAVENOUS
  Filled 2011-04-28: qty 40

## 2011-04-28 MED ORDER — ACETAMINOPHEN 325 MG PO TABS: 650.0000 mg | ORAL_TABLET | Freq: Once | ORAL | Status: DC

## 2011-04-28 MED ORDER — DIPHENHYDRAMINE HCL 25 MG PO TABS
25.0000 mg | ORAL_TABLET | Freq: Once | ORAL | Status: DC
  Filled 2011-04-28: qty 1

## 2011-05-15 ENCOUNTER — Ambulatory Visit: Payer: Self-pay

## 2011-05-15 ENCOUNTER — Other Ambulatory Visit: Payer: Self-pay | Admitting: Occupational Medicine

## 2011-05-15 DIAGNOSIS — R52 Pain, unspecified: Secondary | ICD-10-CM

## 2011-06-20 ENCOUNTER — Other Ambulatory Visit (HOSPITAL_COMMUNITY): Payer: Self-pay | Admitting: *Deleted

## 2011-06-23 ENCOUNTER — Encounter (HOSPITAL_COMMUNITY)
Admission: RE | Admit: 2011-06-23 | Discharge: 2011-06-23 | Disposition: A | Discharge status: Home / Self Care | Payer: BC Managed Care – PPO | Source: Ambulatory Visit | Attending: Gastroenterology | Admitting: Gastroenterology

## 2011-06-23 DIAGNOSIS — K5150 Left sided colitis without complications: Secondary | ICD-10-CM | POA: Insufficient documentation

## 2011-06-23 MED ORDER — ACETAMINOPHEN 325 MG PO TABS
650.0000 mg | ORAL_TABLET | Freq: Once | ORAL | Status: DC
  Filled 2011-06-23: qty 2

## 2011-06-23 MED ORDER — SODIUM CHLORIDE 0.9 % IV SOLN
Freq: Once | INTRAVENOUS | Status: AC
  Administered 2011-06-23: 250 mL via INTRAVENOUS

## 2011-06-23 MED ORDER — SODIUM CHLORIDE 0.9 % IV SOLN
5.0000 mg/kg | Freq: Once | INTRAVENOUS | Status: AC
  Administered 2011-06-23: 400 mg via INTRAVENOUS
  Filled 2011-06-23: qty 40

## 2011-06-23 MED ORDER — DIPHENHYDRAMINE HCL 25 MG PO TABS
25.0000 mg | ORAL_TABLET | Freq: Once | ORAL | Status: DC
  Filled 2011-06-23 (×2): qty 1

## 2011-06-23 NOTE — Progress Notes (Signed)
Pt states he took tylenol and benadryl at home this am at [facility]

## 2011-08-18 ENCOUNTER — Encounter (HOSPITAL_COMMUNITY)
Admission: RE | Admit: 2011-08-18 | Discharge: 2011-08-18 | Disposition: A | Discharge status: Home / Self Care | Payer: BC Managed Care – PPO | Source: Ambulatory Visit | Attending: Gastroenterology | Admitting: Gastroenterology

## 2011-08-18 DIAGNOSIS — K5150 Left sided colitis without complications: Secondary | ICD-10-CM | POA: Insufficient documentation

## 2011-08-18 MED ORDER — DIPHENHYDRAMINE HCL 25 MG PO TABS
25.0000 mg | ORAL_TABLET | ORAL | Status: DC
  Filled 2011-08-18 (×2): qty 1

## 2011-08-18 MED ORDER — SODIUM CHLORIDE 0.9 % IV SOLN
INTRAVENOUS | Status: DC
  Administered 2011-08-18: 250 mL via INTRAVENOUS

## 2011-08-18 MED ORDER — SODIUM CHLORIDE 0.9 % IV SOLN
5.0000 mg/kg | INTRAVENOUS | Status: DC
  Administered 2011-08-18: 400 mg via INTRAVENOUS
  Filled 2011-08-18: qty 40

## 2011-08-18 MED ORDER — ACETAMINOPHEN 325 MG PO TABS
650.0000 mg | ORAL_TABLET | ORAL | Status: DC
  Filled 2011-08-18: qty 2

## 2011-09-05 ENCOUNTER — Encounter (HOSPITAL_COMMUNITY): Payer: Self-pay | Admitting: Pharmacy Technician

## 2011-09-10 ENCOUNTER — Ambulatory Visit (HOSPITAL_COMMUNITY)
Admission: RE | Admit: 2011-09-10 | Discharge: 2011-09-10 | Disposition: A | Discharge status: Home / Self Care | Payer: BC Managed Care – PPO | Source: Ambulatory Visit | Attending: Orthopedic Surgery | Admitting: Orthopedic Surgery

## 2011-09-10 ENCOUNTER — Encounter (HOSPITAL_COMMUNITY): Payer: Self-pay

## 2011-09-10 ENCOUNTER — Encounter (HOSPITAL_COMMUNITY)
Admission: RE | Admit: 2011-09-10 | Discharge: 2011-09-10 | Disposition: A | Discharge status: Home / Self Care | Payer: BC Managed Care – PPO | Source: Ambulatory Visit | Attending: Orthopedic Surgery | Admitting: Orthopedic Surgery

## 2011-09-10 DIAGNOSIS — Z01818 Encounter for other preprocedural examination: Secondary | ICD-10-CM | POA: Insufficient documentation

## 2011-09-10 HISTORY — DX: Ulcerative colitis, unspecified, without complications: K51.90

## 2011-09-10 HISTORY — DX: Adverse effect of unspecified anesthetic, initial encounter: T41.45XA

## 2011-09-10 HISTORY — DX: Essential (primary) hypertension: I10

## 2011-09-10 HISTORY — DX: Other complications of anesthesia, initial encounter: T88.59XA

## 2011-09-10 HISTORY — DX: Unspecified osteoarthritis, unspecified site: M19.90

## 2011-09-10 HISTORY — DX: Sleep disorder, unspecified: G47.9

## 2011-09-10 HISTORY — DX: Pain in unspecified shoulder: M25.519

## 2011-09-10 LAB — COMPREHENSIVE METABOLIC PANEL
ALT: 20 U/L (ref 0–53)
AST: 14 U/L (ref 0–37)
Albumin: 4.6 g/dL (ref 3.5–5.2)
Alkaline Phosphatase: 54 U/L (ref 39–117)
BUN: 16 mg/dL (ref 6–23)
CO2: 27 mEq/L (ref 19–32)
Calcium: 10 mg/dL (ref 8.4–10.5)
Chloride: 102 mEq/L (ref 96–112)
Creatinine, Ser: 0.82 mg/dL (ref 0.50–1.35)
GFR calc Af Amer: >90 mL/min (ref >90)
GFR calc non Af Amer: >90 mL/min (ref >90)
Glucose, Bld: 106 mg/dL — ABNORMAL HIGH (ref 70–99)
Potassium: 4.6 mEq/L (ref 3.5–5.1)
Sodium: 138 mEq/L (ref 135–145)
Total Bilirubin: 0.4 mg/dL (ref 0.3–1.2)
Total Protein: 7.6 g/dL (ref 6.0–8.3)

## 2011-09-10 LAB — CBC
HCT: 41.3 % (ref 39.0–52.0)
Hemoglobin: 13.7 g/dL (ref 13.0–17.0)
MCH: 29.3 pg (ref 26.0–34.0)
MCHC: 33.2 g/dL (ref 30.0–36.0)
MCV: 88.2 fL (ref 78.0–100.0)
Platelets: 281 10*3/uL (ref 150–400)
RBC: 4.68 MIL/uL (ref 4.22–5.81)
RDW: 13.6 % (ref 11.5–15.5)
WBC: 5.2 10*3/uL (ref 4.0–10.5)

## 2011-09-10 LAB — URINALYSIS, ROUTINE W REFLEX MICROSCOPIC
Bilirubin Urine: NEGATIVE
Glucose, UA: NEGATIVE mg/dL
Hgb urine dipstick: NEGATIVE
Ketones, ur: NEGATIVE mg/dL
Leukocytes, UA: NEGATIVE
Nitrite: NEGATIVE
Protein, ur: NEGATIVE mg/dL
Specific Gravity, Urine: 1.027 (ref 1.005–1.030)
Urobilinogen, UA: 0.2 mg/dL (ref 0.0–1.0)
pH: 5 (ref 5.0–8.0)

## 2011-09-10 LAB — DIFFERENTIAL
Basophils Absolute: 0 10*3/uL (ref 0.0–0.1)
Basophils Relative: 0 % (ref 0–1)
Eosinophils Absolute: 0.1 10*3/uL (ref 0.0–0.7)
Eosinophils Relative: 1 % (ref 0–5)
Lymphocytes Relative: 26 % (ref 12–46)
Lymphs Abs: 1.3 10*3/uL (ref 0.7–4.0)
Monocytes Absolute: 0.5 10*3/uL (ref 0.1–1.0)
Monocytes Relative: 9 % (ref 3–12)
Neutro Abs: 3.3 10*3/uL (ref 1.7–7.7)
Neutrophils Relative %: 63 % (ref 43–77)

## 2011-09-10 LAB — ABO/RH: ABO/RH(D): AB POS

## 2011-09-10 LAB — PROTIME-INR
INR: 0.96 (ref 0.00–1.49)
Prothrombin Time: 13 seconds (ref 11.6–15.2)

## 2011-09-10 LAB — APTT: aPTT: 34 seconds (ref 24–37)

## 2011-09-10 LAB — SURGICAL PCR SCREEN
MRSA, PCR: NEGATIVE
Staphylococcus aureus: NEGATIVE

## 2011-09-10 MED ORDER — CHLORHEXIDINE GLUCONATE 4 % EX LIQD
60.0000 mL | Freq: Once | CUTANEOUS | Status: DC
  Filled 2011-09-10: qty 60

## 2011-09-10 NOTE — Progress Notes (Signed)
09/10/11 1251  OBSTRUCTIVE SLEEP APNEA  Have you ever been diagnosed with sleep apnea through a sleep study? No  Do you snore loudly (loud enough to be heard through closed doors)?  1  Do you often feel tired, fatigued, or sleepy during the daytime? 1  Has anyone observed you stop breathing during your sleep? 1  Do you have, or are you being treated for high blood pressure? 1  BMI more than 35 kg/m2? 0  Age over 63 years old? 1  Neck circumference greater than 40 cm/18 inches? 0  Gender: 1  Obstructive Sleep Apnea Score 6   Score 4 or greater  Updated health history;Results sent to PCP

## 2011-09-10 NOTE — Patient Instructions (Signed)
20 Levi Turner  09/10/2011   Your procedure is scheduled on:  09/17/11  Report to SHORT STAY DEPT  at 7:30 AM.  Call this number if you have problems the morning of surgery: [phone]   Remember:   Do not eat food or drink liquids AFTER MIDNIGHT    Take these medicines the morning of surgery with A SIP OF WATER: NONE   Do not wear jewelry, make-up or nail polish.  Do not wear lotions, powders, or perfumes.   Do not shave legs or underarms 48 hrs. before surgery (men may shave face)  Do not bring valuables to the hospital.  Contacts, dentures or bridgework may not be worn into surgery.  Leave suitcase in the car. After surgery it may be brought to your room.  For patients admitted to the hospital, checkout time is 11:00 AM the day of discharge.   Patients discharged the day of surgery will not be allowed to drive home.    Special Instructions:   Please read over the following fact sheets that you were given: MRSA  Information / Incentive Spirometer               SHOWER WITH BETASEPT THE NIGHT BEFORE SURGERY AND THE MORNING OF SURGERY

## 2011-09-12 NOTE — H&P (Signed)
Levi Turner DOB: [birth year]  Chief Complaint: right knee pain  History of Present Illness The patient is a 61 year old male who comes in today for a preoperative History and Physical. The patient is scheduled for a right total knee arthroplasty to be performed by Dr. Georges Lynch. [name], MD at [facility] on Wednesday September 17, 2011 . The patient reports right knee symptoms including pain without any known injury. The patient describes the severity of the symptoms as moderate in severity. Symptoms include knee pain, swelling, stiffness, locking, difficulty bearing weight and difficulty ambulating. The patient describes their pain as sharp. Patient states had arthroscopy years ago. Patient states that he has had the viscosupplementation injections as well. Patient states knee pain is getting worse. Due to failure of conservative measures, the most predictable means for increased function and decreased pain in the right knee is a right total knee arthroplasty. Risks and benefits of the surgery discussed.  PCP: Dr. Scherrie Bateman: Dr. Dorena Cookey    Problem List/Past Medical History Tenosynovitis, bicipital (726.12). 03/11/1999 Osteoarthrosis NOS, ankle/foot (715.97). 04/02/2009 Sprain/strain, rotator cuff (840.4). 04/02/2009 Tear, medial meniscus, knee, current (836.0). 04/02/2009 Synovitis, villonodular, lower leg (719.26). 04/02/2009 Osteoarthritis Ulcerative Colitis Hypercholesterolemia Glaucoma Hypertension  Allergies No Known Drug Allergies. 08/25/2011   Family History Cancer. father Heart Disease. mother and brother Heart disease in male family member before age 45 Father. deceased age 74 due to lung cancer Mother. deceased age 26 due to MI, Legionaire's disease Siblings. Brother. deceased age late 79s due to MI   Social History Exercise. Exercises daily; does running / walking and individual sport Illicit drug use. no Living  situation. live with spouse Children. 2 Current work status. disabled Drug/Alcohol Rehab (Currently). no Marital status. married Previously in rehab. no Tobacco / smoke exposure. no Tobacco use. former smoker; smoke(d) 2 pack(s) per day Most recent primary occupation. Pharmacologist Number of flights of stairs before winded. greater than 5 Pain Contract. no Alcohol use. former drinker Post-Surgical Plans. wife will be caregiver Advance Directives. none   Medication History Lialda (1.2GM Tablet DR, Oral daily) Active. (four tablets) Mercaptopurine (50MG  Tablet, Oral daily) Active. Simvastatin (80MG  Tablet, Oral at bedtime) Active. Lisinopril (20MG  Tablet, Oral at bedtime) Active. (two tablets) AmLODIPine Besylate (5MG  Tablet, Oral at bedtime) Active. Melatonin (10MG  Tablet, Oral at bedtime) Active. (two tablets) Acidophilus ( Oral daily) Active. Saw Palmetto (Serenoa repens) (450MG  Capsule, Oral daily) Active. Glucosamine Sulfate (1000MG  Tablet, Oral two times daily) Active. (2000mg ) Potassium Gluconate (595MG  Capsule, Oral two times daily) Active. Ester-C ( Oral daily) Active. Calcium + D (600-200MG -UNIT Tablet, Oral two times daily) Active. Vitamin D3 (2000UNIT Capsule, Oral two times daily) Active. Vitamin E Blend (400UNIT Capsule, Oral daily) Active. Magnesium Carbonate (250MG /GM Powder, Oral two times daily) Active. Iron Supplement (325 (65 Fe)MG Tablet, Oral four times daily) Active. Multiple Vitamin (1 Oral) Active. Tylenol Extra Strength (500MG  Tablet, Oral) Active. (three tablets qam, two tablets qhs) Tylenol PM Extra Strength (500-25MG  Tablet, Oral at bedtime) Active. Super B Complex/C ( Oral daily) Active. Multiple Vitamins ( Oral two times daily) Active.   Past Surgical History Rotator Cuff Repair. left Arthroscopy of Knee. right Foot Surgery. left Inguinal Hernia Repair. open: bilateral   Review of  Systems General:Present- Night Sweats. Not Present- Chills, Fever, Fatigue, Weight Gain, Weight Loss and Memory Loss. Skin:Not Present- Hives, Itching, Rash, Eczema and Lesions. HEENT:Not Present- Tinnitus, Headache, Double Vision, Visual Loss, Hearing Loss and Dentures. Respiratory:Not Present- Shortness of  breath with exertion, Shortness of breath at rest, Allergies, Coughing up blood and Chronic Cough. Cardiovascular:Not Present- Chest Pain, Racing/skipping heartbeats, Difficulty Breathing Lying Down, Murmur, Swelling and Palpitations. Gastrointestinal:Present- Bloody Stool, Abdominal Pain and Loss of appetitie. Not Present- Heartburn, Vomiting, Nausea, Constipation, Diarrhea, Difficulty Swallowing and Jaundice. Male Genitourinary:Not Present- Urinary frequency, Blood in Urine, Weak urinary stream, Discharge, Flank Pain, Incontinence, Painful Urination, Urgency, Urinary Retention and Urinating at Night. Musculoskeletal:Present- Muscle Weakness, Muscle Pain and Joint Pain. Not Present- Joint Swelling, Back Pain, Morning Stiffness and Spasms. Neurological:Not Present- Tremor, Dizziness, Blackout spells, Paralysis, Difficulty with balance and Weakness. Psychiatric:Present- Insomnia.   Vitals Weight: 170 lb Height: 72 in Body Surface Area: 1.98 m Body Mass Index: 23.06 kg/m Pulse: 72 (Regular) Resp.: 16 (Unlabored) BP: 148/84 (Sitting, Left Arm, Standard)    Physical Exam General Mental Status - Alert, cooperative and good historian. General Appearance- pleasant. Not in acute distress. Orientation- Oriented X3. Build & Nutrition- Well nourished and Well developed. Head and Neck Head- normocephalic, atraumatic . Neck Global Assessment- supple. no bruit auscultated on the right and no bruit auscultated on the left. Eye Pupil- Bilateral- Normal. Motion- Bilateral- EOMI. Chest and Lung Exam Auscultation: Breath sounds:- clear at anterior chest  wall and - clear at posterior chest wall. Adventitious sounds:- No Adventitious sounds. Cardiovascular Auscultation:Rhythm- Regular rate and rhythm. Heart Sounds- S1 WNL and S2 WNL. Murmurs & Other Heart Sounds:Auscultation of the heart reveals - No Murmurs. Abdomen Palpation/Percussion:Tenderness- Abdomen is non-tender to palpation. Rigidity (guarding)- Abdomen is soft. Auscultation:Auscultation of the abdomen reveals - Bowel sounds normal. Male Genitourinary Not done, not pertinent to present illness Peripheral Vascular Upper Extremity: Palpation:- Pulses bilaterally normal. Lower Extremity: Palpation:- Pulses bilaterally normal. Neurologic Examination of related systems reveals - normal muscle strength and tone in all extremities. Neurologic evaluation reveals - normal sensation and upper and lower extremity deep tendon reflexes intact bilaterally . Musculoskeletal He has a painful range of motion on physical exam. His range of motion surprisingly is intact except he may lack about 10 degrees complete flexion. He extends to neutral. He has a genu varus. Minimal knee effusion. Patella tracks well in the midline. Cruciates and collateral ligaments are intact. RADIOGRAPHS: X-rays of his knees show complete collapse of the medial joint with bone on bone.   Assessment & Plan Osteoarthrosis, knee (715.96) Right total knee arthroplasty      Dimitri Ped, PA-C

## 2011-09-17 ENCOUNTER — Encounter (HOSPITAL_COMMUNITY): Payer: Self-pay | Admitting: *Deleted

## 2011-09-17 ENCOUNTER — Ambulatory Visit (HOSPITAL_COMMUNITY): Payer: BC Managed Care – PPO

## 2011-09-17 ENCOUNTER — Ambulatory Visit (HOSPITAL_COMMUNITY): Payer: BC Managed Care – PPO | Admitting: Anesthesiology

## 2011-09-17 ENCOUNTER — Inpatient Hospital Stay (HOSPITAL_COMMUNITY)
Admission: RE | Admit: 2011-09-17 | Discharge: 2011-09-20 | DRG: 209 | Disposition: A | Discharge status: Home Health | Payer: BC Managed Care – PPO | Source: Ambulatory Visit | Attending: Orthopedic Surgery | Admitting: Orthopedic Surgery

## 2011-09-17 ENCOUNTER — Encounter (HOSPITAL_COMMUNITY)
Admission: RE | Disposition: A | Discharge status: Home Health | Payer: Self-pay | Source: Ambulatory Visit | Attending: Orthopedic Surgery

## 2011-09-17 ENCOUNTER — Encounter (HOSPITAL_COMMUNITY): Payer: Self-pay | Admitting: Anesthesiology

## 2011-09-17 DIAGNOSIS — M1710 Unilateral primary osteoarthritis, unspecified knee: Principal | ICD-10-CM | POA: Diagnosis present

## 2011-09-17 DIAGNOSIS — G4730 Sleep apnea, unspecified: Secondary | ICD-10-CM | POA: Diagnosis present

## 2011-09-17 DIAGNOSIS — I10 Essential (primary) hypertension: Secondary | ICD-10-CM | POA: Diagnosis present

## 2011-09-17 DIAGNOSIS — M179 Osteoarthritis of knee, unspecified: Secondary | ICD-10-CM | POA: Diagnosis present

## 2011-09-17 DIAGNOSIS — Z96659 Presence of unspecified artificial knee joint: Secondary | ICD-10-CM

## 2011-09-17 LAB — TYPE AND SCREEN
ABO/RH(D): AB POS
Antibody Screen: NEGATIVE

## 2011-09-17 SURGERY — ARTHROPLASTY, KNEE, TOTAL
Anesthesia: General | Site: Knee | Laterality: Right | Wound class: Clean

## 2011-09-17 MED ORDER — FERROUS SULFATE 325 (65 FE) MG PO TABS
325.0000 mg | ORAL_TABLET | Freq: Three times a day (TID) | ORAL | Status: DC
Start: 2011-09-17 — End: 2011-09-20
  Administered 2011-09-17 – 2011-09-20 (×8): 325 mg via ORAL
  Filled 2011-09-17 (×12): qty 1

## 2011-09-17 MED ORDER — HYDROMORPHONE HCL PF 1 MG/ML IJ SOLN
INTRAMUSCULAR | Status: AC
  Filled 2011-09-17: qty 1

## 2011-09-17 MED ORDER — POLYETHYLENE GLYCOL 3350 17 G PO PACK: 17.0000 g | PACK | Freq: Every day | ORAL | Status: DC | PRN

## 2011-09-17 MED ORDER — LACTATED RINGERS IV SOLN: INTRAVENOUS | Status: DC

## 2011-09-17 MED ORDER — MESALAMINE 1.2 G PO TBEC
4800.0000 mg | DELAYED_RELEASE_TABLET | Freq: Every day | ORAL | Status: DC
  Administered 2011-09-18 – 2011-09-20 (×3): 4.8 g via ORAL
  Filled 2011-09-17 (×4): qty 4

## 2011-09-17 MED ORDER — CEFAZOLIN SODIUM 1-5 GM-% IV SOLN
1.0000 g | INTRAVENOUS | Status: AC
  Administered 2011-09-17: 1 g via INTRAVENOUS

## 2011-09-17 MED ORDER — BUPIVACAINE LIPOSOME 1.3 % IJ SUSP
20.0000 mL | Freq: Once | INTRAMUSCULAR | Status: AC
  Administered 2011-09-17: 20 mL
  Filled 2011-09-17: qty 20

## 2011-09-17 MED ORDER — AMLODIPINE BESYLATE 5 MG PO TABS
5.0000 mg | ORAL_TABLET | Freq: Every day | ORAL | Status: DC
  Administered 2011-09-17 – 2011-09-19 (×3): 5 mg via ORAL
  Filled 2011-09-17 (×4): qty 1

## 2011-09-17 MED ORDER — ONDANSETRON HCL 4 MG/2ML IJ SOLN
INTRAMUSCULAR | Status: DC | PRN
  Administered 2011-09-17: 4 mg via INTRAVENOUS

## 2011-09-17 MED ORDER — ACETAMINOPHEN 650 MG RE SUPP: 650.0000 mg | Freq: Four times a day (QID) | RECTAL | Status: DC | PRN

## 2011-09-17 MED ORDER — PROPOFOL 10 MG/ML IV BOLUS
INTRAVENOUS | Status: DC | PRN
  Administered 2011-09-17: 170 mg via INTRAVENOUS

## 2011-09-17 MED ORDER — ACETAMINOPHEN 10 MG/ML IV SOLN
INTRAVENOUS | Status: AC
  Filled 2011-09-17: qty 100

## 2011-09-17 MED ORDER — LACTATED RINGERS IV SOLN
INTRAVENOUS | Status: DC
  Administered 2011-09-17 – 2011-09-18 (×3): via INTRAVENOUS

## 2011-09-17 MED ORDER — METHOCARBAMOL 500 MG PO TABS
500.0000 mg | ORAL_TABLET | Freq: Four times a day (QID) | ORAL | Status: DC | PRN
  Administered 2011-09-17 – 2011-09-20 (×7): 500 mg via ORAL
  Filled 2011-09-17 (×7): qty 1

## 2011-09-17 MED ORDER — POTASSIUM GLUCONATE 595 (99 K) MG PO TABS
595.0000 mg | ORAL_TABLET | Freq: Once | ORAL | Status: AC
  Administered 2011-09-17: 595 mg via ORAL
  Filled 2011-09-17: qty 1

## 2011-09-17 MED ORDER — BACITRACIN ZINC 500 UNIT/GM EX OINT
TOPICAL_OINTMENT | CUTANEOUS | Status: AC
  Filled 2011-09-17: qty 15

## 2011-09-17 MED ORDER — LISINOPRIL 20 MG PO TABS
40.0000 mg | ORAL_TABLET | Freq: Every day | ORAL | Status: DC
  Administered 2011-09-17 – 2011-09-19 (×3): 40 mg via ORAL
  Filled 2011-09-17 (×4): qty 2

## 2011-09-17 MED ORDER — ACETAMINOPHEN 10 MG/ML IV SOLN
INTRAVENOUS | Status: DC | PRN
  Administered 2011-09-17: 1000 mg via INTRAVENOUS

## 2011-09-17 MED ORDER — ALUM & MAG HYDROXIDE-SIMETH 200-200-20 MG/5ML PO SUSP: 30.0000 mL | ORAL | Status: DC | PRN

## 2011-09-17 MED ORDER — FENTANYL CITRATE 0.05 MG/ML IJ SOLN
INTRAMUSCULAR | Status: DC | PRN
  Administered 2011-09-17: 100 ug via INTRAVENOUS
  Administered 2011-09-17 (×5): 50 ug via INTRAVENOUS

## 2011-09-17 MED ORDER — THROMBIN 5000 UNITS EX SOLR
CUTANEOUS | Status: AC
  Filled 2011-09-17: qty 5000

## 2011-09-17 MED ORDER — HYDROMORPHONE HCL PF 1 MG/ML IJ SOLN
0.2500 mg | INTRAMUSCULAR | Status: DC | PRN
  Administered 2011-09-17 (×4): 0.5 mg via INTRAVENOUS

## 2011-09-17 MED ORDER — SIMVASTATIN 20 MG PO TABS
20.0000 mg | ORAL_TABLET | Freq: Every evening | ORAL | Status: DC
  Administered 2011-09-17 – 2011-09-19 (×3): 20 mg via ORAL
  Filled 2011-09-17 (×4): qty 1

## 2011-09-17 MED ORDER — METHOCARBAMOL 100 MG/ML IJ SOLN
500.0000 mg | Freq: Four times a day (QID) | INTRAVENOUS | Status: DC | PRN
  Administered 2011-09-17: 500 mg via INTRAVENOUS
  Filled 2011-09-17 (×2): qty 5

## 2011-09-17 MED ORDER — LACTATED RINGERS IV SOLN
INTRAVENOUS | Status: DC
  Administered 2011-09-17: 11:00:00 via INTRAVENOUS
  Administered 2011-09-17: 1000 mL via INTRAVENOUS
  Administered 2011-09-17: 12:00:00 via INTRAVENOUS

## 2011-09-17 MED ORDER — SODIUM CHLORIDE 0.9 % IR SOLN
Status: DC | PRN
  Administered 2011-09-17: 3000 mL

## 2011-09-17 MED ORDER — THROMBIN 5000 UNITS EX SOLR
CUTANEOUS | Status: DC | PRN
  Administered 2011-09-17: 5000 [IU] via TOPICAL

## 2011-09-17 MED ORDER — ACETAMINOPHEN 325 MG PO TABS: 650.0000 mg | ORAL_TABLET | Freq: Four times a day (QID) | ORAL | Status: DC | PRN

## 2011-09-17 MED ORDER — CEFAZOLIN SODIUM 1-5 GM-% IV SOLN
1.0000 g | Freq: Four times a day (QID) | INTRAVENOUS | Status: AC
Start: 2011-09-17 — End: 2011-09-17
  Administered 2011-09-17 (×2): 1 g via INTRAVENOUS
  Filled 2011-09-17 (×2): qty 50

## 2011-09-17 MED ORDER — BISACODYL 10 MG RE SUPP: 10.0000 mg | Freq: Every day | RECTAL | Status: DC | PRN

## 2011-09-17 MED ORDER — ONDANSETRON HCL 4 MG/2ML IJ SOLN: 4.0000 mg | Freq: Four times a day (QID) | INTRAMUSCULAR | Status: DC | PRN

## 2011-09-17 MED ORDER — NEOSTIGMINE METHYLSULFATE 1 MG/ML IJ SOLN
INTRAMUSCULAR | Status: DC | PRN
  Administered 2011-09-17: 3 mg via INTRAVENOUS

## 2011-09-17 MED ORDER — LIDOCAINE HCL (CARDIAC) 20 MG/ML IV SOLN
INTRAVENOUS | Status: DC | PRN
  Administered 2011-09-17: 50 mg via INTRAVENOUS

## 2011-09-17 MED ORDER — SODIUM CHLORIDE 0.9 % IR SOLN
Status: DC | PRN
  Administered 2011-09-17: 11:00:00

## 2011-09-17 MED ORDER — EPHEDRINE SULFATE 50 MG/ML IJ SOLN
INTRAMUSCULAR | Status: DC | PRN
  Administered 2011-09-17 (×2): 5 mg via INTRAVENOUS

## 2011-09-17 MED ORDER — ONDANSETRON HCL 4 MG PO TABS: 4.0000 mg | ORAL_TABLET | Freq: Four times a day (QID) | ORAL | Status: DC | PRN

## 2011-09-17 MED ORDER — ROCURONIUM BROMIDE 100 MG/10ML IV SOLN
INTRAVENOUS | Status: DC | PRN
  Administered 2011-09-17: 50 mg via INTRAVENOUS

## 2011-09-17 MED ORDER — OXYCODONE-ACETAMINOPHEN 5-325 MG PO TABS
2.0000 | ORAL_TABLET | ORAL | Status: DC | PRN
  Administered 2011-09-17 – 2011-09-19 (×11): 2 via ORAL
  Filled 2011-09-17 (×12): qty 2

## 2011-09-17 MED ORDER — VITAMIN E 180 MG (400 UNIT) PO CAPS
400.0000 [IU] | ORAL_CAPSULE | Freq: Every day | ORAL | Status: DC
  Administered 2011-09-17 – 2011-09-20 (×4): 400 [IU] via ORAL
  Filled 2011-09-17 (×5): qty 1

## 2011-09-17 MED ORDER — GLYCOPYRROLATE 0.2 MG/ML IJ SOLN
INTRAMUSCULAR | Status: DC | PRN
  Administered 2011-09-17: .5 mg via INTRAVENOUS

## 2011-09-17 MED ORDER — VITAMIN C 500 MG PO TABS
500.0000 mg | ORAL_TABLET | Freq: Every day | ORAL | Status: DC
Start: 2011-09-17 — End: 2011-09-20
  Administered 2011-09-17 – 2011-09-20 (×4): 500 mg via ORAL
  Filled 2011-09-17 (×4): qty 1

## 2011-09-17 MED ORDER — PHENOL 1.4 % MT LIQD
1.0000 | OROMUCOSAL | Status: DC | PRN
  Filled 2011-09-17: qty 177

## 2011-09-17 MED ORDER — HYDROMORPHONE HCL PF 1 MG/ML IJ SOLN
1.0000 mg | INTRAMUSCULAR | Status: DC | PRN
  Administered 2011-09-17 – 2011-09-18 (×4): 1 mg via INTRAVENOUS
  Filled 2011-09-17 (×4): qty 1

## 2011-09-17 MED ORDER — FLEET ENEMA 7-19 GM/118ML RE ENEM: 1.0000 | ENEMA | Freq: Once | RECTAL | Status: AC | PRN

## 2011-09-17 MED ORDER — CEFAZOLIN SODIUM 1-5 GM-% IV SOLN
INTRAVENOUS | Status: AC
  Filled 2011-09-17: qty 50

## 2011-09-17 MED ORDER — RIVAROXABAN 10 MG PO TABS
10.0000 mg | ORAL_TABLET | Freq: Every day | ORAL | Status: DC
  Administered 2011-09-18 – 2011-09-20 (×3): 10 mg via ORAL
  Filled 2011-09-17 (×4): qty 1

## 2011-09-17 MED ORDER — MENTHOL 3 MG MT LOZG
1.0000 | LOZENGE | OROMUCOSAL | Status: DC | PRN
  Filled 2011-09-17: qty 9

## 2011-09-17 MED ORDER — MERCAPTOPURINE 50 MG PO TABS
50.0000 mg | ORAL_TABLET | Freq: Every day | ORAL | Status: DC
  Administered 2011-09-18 – 2011-09-20 (×3): 50 mg via ORAL
  Filled 2011-09-17 (×4): qty 1

## 2011-09-17 MED ORDER — PROMETHAZINE HCL 25 MG/ML IJ SOLN: 6.2500 mg | INTRAMUSCULAR | Status: DC | PRN

## 2011-09-17 SURGICAL SUPPLY — 64 items
BAG ZIPLOCK 12X15 (MISCELLANEOUS) ×2 IMPLANT
BANDAGE ELASTIC 4 VELCRO ST LF (GAUZE/BANDAGES/DRESSINGS) ×2 IMPLANT
BANDAGE ELASTIC 6 VELCRO ST LF (GAUZE/BANDAGES/DRESSINGS) ×2 IMPLANT
BANDAGE ESMARK 6X9 LF (GAUZE/BANDAGES/DRESSINGS) ×1 IMPLANT
BANDAGE GAUZE ELAST BULKY 4 IN (GAUZE/BANDAGES/DRESSINGS) ×2 IMPLANT
BLADE SAG 18X100X1.27 (BLADE) ×2 IMPLANT
BLADE SAW SGTL 13.0X1.19X90.0M (BLADE) ×2 IMPLANT
BNDG ESMARK 6X9 LF (GAUZE/BANDAGES/DRESSINGS) ×2
BONE CEMENT GENTAMICIN (Cement) ×4 IMPLANT
CEMENT BONE GENTAMICIN 40 (Cement) ×2 IMPLANT
CLOTH BEACON ORANGE TIMEOUT ST (SAFETY) ×2 IMPLANT
CUFF TOURN SGL QUICK 34 (TOURNIQUET CUFF) ×1
CUFF TRNQT CYL 34X4X40X1 (TOURNIQUET CUFF) ×1 IMPLANT
DRAPE EXTREMITY T 121X128X90 (DRAPE) ×2 IMPLANT
DRAPE INCISE IOBAN 66X45 STRL (DRAPES) ×2 IMPLANT
DRAPE LG THREE QUARTER DISP (DRAPES) ×4 IMPLANT
DRAPE POUCH INSTRU U-SHP 10X18 (DRAPES) ×2 IMPLANT
DRAPE U-SHAPE 47X51 STRL (DRAPES) ×2 IMPLANT
DRSG ADAPTIC 3X8 NADH LF (GAUZE/BANDAGES/DRESSINGS) ×2 IMPLANT
DRSG PAD ABDOMINAL 8X10 ST (GAUZE/BANDAGES/DRESSINGS) ×8 IMPLANT
DURAPREP 26ML APPLICATOR (WOUND CARE) ×2 IMPLANT
ELECT REM PT RETURN 9FT ADLT (ELECTROSURGICAL) ×2
ELECTRODE REM PT RTRN 9FT ADLT (ELECTROSURGICAL) ×1 IMPLANT
EVACUATOR 1/8 PVC DRAIN (DRAIN) ×2 IMPLANT
FACESHIELD LNG OPTICON STERILE (SAFETY) ×10 IMPLANT
FLOSEAL 10ML (HEMOSTASIS) IMPLANT
GLOVE BIOGEL PI IND STRL 8 (GLOVE) ×1 IMPLANT
GLOVE BIOGEL PI IND STRL 8.5 (GLOVE) IMPLANT
GLOVE BIOGEL PI INDICATOR 8 (GLOVE) ×1
GLOVE BIOGEL PI INDICATOR 8.5 (GLOVE)
GLOVE ECLIPSE 8.0 STRL XLNG CF (GLOVE) ×4 IMPLANT
GLOVE SURG SS PI 6.5 STRL IVOR (GLOVE) ×4 IMPLANT
GOWN PREVENTION PLUS LG XLONG (DISPOSABLE) ×4 IMPLANT
GOWN STRL REIN XL XLG (GOWN DISPOSABLE) ×2 IMPLANT
HANDPIECE INTERPULSE COAX TIP (DISPOSABLE) ×1
IMMOBILIZER KNEE 20 (SOFTGOODS) ×2
IMMOBILIZER KNEE 20 THIGH 36 (SOFTGOODS) ×1 IMPLANT
KIT BASIN OR (CUSTOM PROCEDURE TRAY) ×2 IMPLANT
MANIFOLD NEPTUNE II (INSTRUMENTS) ×2 IMPLANT
NEEDLE HYPO 22GX1.5 SAFETY (NEEDLE) ×2 IMPLANT
NS IRRIG 1000ML POUR BTL (IV SOLUTION) IMPLANT
PACK TOTAL JOINT (CUSTOM PROCEDURE TRAY) ×2 IMPLANT
PAD ABD 7.5X8 STRL (GAUZE/BANDAGES/DRESSINGS) IMPLANT
POSITIONER SURGICAL ARM (MISCELLANEOUS) ×2 IMPLANT
SET HNDPC FAN SPRY TIP SCT (DISPOSABLE) ×1 IMPLANT
SET PAD KNEE POSITIONER (MISCELLANEOUS) ×2 IMPLANT
SPONGE GAUZE 4X4 12PLY (GAUZE/BANDAGES/DRESSINGS) ×2 IMPLANT
SPONGE LAP 18X18 X RAY DECT (DISPOSABLE) ×2 IMPLANT
SPONGE SURGIFOAM ABS GEL 100 (HEMOSTASIS) ×2 IMPLANT
STAPLER VISISTAT 35W (STAPLE) IMPLANT
SUCTION FRAZIER 12FR DISP (SUCTIONS) ×2 IMPLANT
SUT BONE WAX W31G (SUTURE) ×2 IMPLANT
SUT VIC AB 0 CT1 27 (SUTURE) ×2
SUT VIC AB 0 CT1 27XBRD ANTBC (SUTURE) ×2 IMPLANT
SUT VIC AB 1 CT1 27 (SUTURE) ×5
SUT VIC AB 1 CT1 27XBRD ANTBC (SUTURE) ×5 IMPLANT
SUT VIC AB 2-0 CT1 27 (SUTURE) ×3
SUT VIC AB 2-0 CT1 TAPERPNT 27 (SUTURE) ×3 IMPLANT
SYR 20CC LL (SYRINGE) ×2 IMPLANT
TOWEL OR 17X26 10 PK STRL BLUE (TOWEL DISPOSABLE) ×4 IMPLANT
TOWER CARTRIDGE SMART MIX (DISPOSABLE) ×2 IMPLANT
TRAY FOLEY CATH 14FRSI W/METER (CATHETERS) ×2 IMPLANT
WATER STERILE IRR 1500ML POUR (IV SOLUTION) ×2 IMPLANT
WRAP KNEE MAXI GEL POST OP (GAUZE/BANDAGES/DRESSINGS) ×4 IMPLANT

## 2011-09-17 NOTE — Progress Notes (Signed)
Utilization review completed.  

## 2011-09-17 NOTE — Anesthesia Preprocedure Evaluation (Signed)
Anesthesia Evaluation  Patient identified by MRN, date of birth, ID band Patient awake    Reviewed: Allergy & Precautions, H&P , NPO status , Patient's Chart, lab work & pertinent test results  History of Anesthesia Complications (+) PROLONGED EMERGENCE  Airway Mallampati: II TM Distance: >3 FB Neck ROM: Full    Dental  (+) Teeth Intact, Caps and Dental Advisory Given,    Pulmonary neg pulmonary ROS, sleep apnea (STOP Bang score 6) ,  breath sounds clear to auscultation        Cardiovascular hypertension, Pt. on medications Rhythm:Regular Rate:Normal     Neuro/Psych negative neurological ROS  negative psych ROS   GI/Hepatic Neg liver ROS, PUD, Ulcerative colitis   Endo/Other  negative endocrine ROS  Renal/GU negative Renal ROS  negative genitourinary   Musculoskeletal negative musculoskeletal ROS (+)   Abdominal   Peds negative pediatric ROS (+)  Hematology negative hematology ROS (+)   Anesthesia Other Findings   Reproductive/Obstetrics negative OB ROS                           Anesthesia Physical Anesthesia Plan  ASA: II  Anesthesia Plan: General   Post-op Pain Management:    Induction: Intravenous  Airway Management Planned: Oral ETT  Additional Equipment:   Intra-op Plan:   Post-operative Plan: Extubation in OR  Informed Consent: I have reviewed the patients History and Physical, chart, labs and discussed the procedure including the risks, benefits and alternatives for the proposed anesthesia with the patient or authorized representative who has indicated his/her understanding and acceptance.   Dental advisory given  Plan Discussed with: CRNA  Anesthesia Plan Comments:         Anesthesia Quick Evaluation

## 2011-09-17 NOTE — Brief Op Note (Signed)
09/17/2011  12:32 PM  PATIENT:  Levi Turner  61 y.o. male  PRE-OPERATIVE DIAGNOSIS:  Osteoarthritis of the Right Knee  POST-OPERATIVE DIAGNOSIS:  Osteoarthritis of the Right Knee  PROCEDURE:  Procedure(s) (LRB): TOTAL KNEE ARTHROPLASTY (Right)  SURGEON:  Surgeon(s) and Role:    * Jacki Cones, MD - Primary  PHYSICIAN ASSISTANT: Dimitri Ped     ANESTHESIA:   general  EBL:  Total I/O In: 2000 [I.V.:2000] Out: 325 [Urine:325]  BLOOD ADMINISTERED:none  DRAINS: none   LOCAL MEDICATIONS USED:  BUPIVICAINE 20cc mixed with 10cc Normal Saline  SPECIMEN:  No Specimen  DISPOSITION OF SPECIMEN:  N/A  COUNTS:  YES  TOURNIQUET:   Total Tourniquet Time Documented: Thigh (Right) - 102 minutes  DICTATION: .Other Dictation: Dictation Number [id number]  PLAN OF CARE: Admit to inpatient   PATIENT DISPOSITION:  Stable in OR   Delay start of Pharmacological VTE agent (>24hrs) due to surgical blood loss or risk of bleeding: yes

## 2011-09-17 NOTE — Interval H&P Note (Signed)
History and Physical Interval Note:  09/17/2011 10:15 AM  Levi Turner  has presented today for surgery, with the diagnosis of Osteoarthritis of the Right Knee  The various methods of treatment have been discussed with the patient and family. After consideration of risks, benefits and other options for treatment, the patient has consented to  Procedure(s) (LRB): TOTAL KNEE ARTHROPLASTY (Right) as a surgical intervention .  The patients' history has been reviewed, patient examined, no change in status, stable for surgery.  I have reviewed the patients' chart and labs.  Questions were answered to the patient's satisfaction.     Royalty Fakhouri A

## 2011-09-17 NOTE — Transfer of Care (Signed)
Immediate Anesthesia Transfer of Care Note  Patient: Levi Turner  Procedure(s) Performed: Procedure(s) (LRB): TOTAL KNEE ARTHROPLASTY (Right)  Patient Location: PACU  Anesthesia Type: General  Level of Consciousness: awake, alert , oriented and patient cooperative  Airway & Oxygen Therapy: Patient Spontanous Breathing and Patient connected to face mask oxygen  Post-op Assessment: Report given to PACU RN, Post -op Vital signs reviewed and stable and Patient moving all extremities  Post vital signs: Reviewed and stable  Complications: No apparent anesthesia complications

## 2011-09-18 DIAGNOSIS — Z96659 Presence of unspecified artificial knee joint: Secondary | ICD-10-CM

## 2011-09-18 LAB — BASIC METABOLIC PANEL
CO2: 27 mEq/L (ref 19–32)
Chloride: 99 mEq/L (ref 96–112)
Creatinine, Ser: 0.74 mg/dL (ref 0.50–1.35)
Glucose, Bld: 134 mg/dL — ABNORMAL HIGH (ref 70–99)

## 2011-09-18 LAB — CBC
Hemoglobin: 11.2 g/dL — ABNORMAL LOW (ref 13.0–17.0)
MCV: 87.9 fL (ref 78.0–100.0)
Platelets: 257 10*3/uL (ref 150–400)
RBC: 3.88 MIL/uL — ABNORMAL LOW (ref 4.22–5.81)
WBC: 8.4 10*3/uL (ref 4.0–10.5)

## 2011-09-18 NOTE — Care Management Note (Unsigned)
    Page 1 of 2   09/18/2011     5:38:38 PM   CARE MANAGEMENT NOTE 09/18/2011  Patient:  Levi Turner, Levi Turner   Account Number:  [id number]  Date Initiated:  09/18/2011  Documentation initiated by:  Colleen Can  Subjective/Objective Assessment:   DX OSTEOARTHRITIS ; TOTAL KNEE REPLACEMNT-RIGHT  Genevieve Norlander referral from doctor's office     Action/Plan:   CM spoke with patient. Plans are for pt to return to his home in [place] where family will be caregivers. He already has RW, commode seat, bath seat, crutches, cane. Wants agency that office referred him to/list placed on chart   Anticipated DC Date:  09/20/2011   Anticipated DC Plan:  HOME W HOME HEALTH SERVICES  In-house referral  Clinical Social Worker      DC Planning Services  CM consult      [facility] Choice  HOME HEALTH   Choice offered to / List presented to:  C-1 Patient   DME arranged  NA      DME agency  NA     HH arranged  HH-2 PT      [facility] agency  [facility]   Status of service:  In process, will continue to follow Medicare Important Message given?  NO (If response is "NO", the following Medicare IM given date fields will be blank) Date Medicare IM given:   Date Additional Medicare IM given:    Discharge Disposition:    Per UR Regulation:    If discussed at Long Length of Stay Meetings, dates discussed:    Comments:

## 2011-09-18 NOTE — Evaluation (Signed)
Physical Therapy Evaluation Patient Details Name: Levi Turner MRN: [id number] DOB: [birth year] Today's Date: 09/18/2011 Time: 7829-5621 PT Time Calculation (min): 23 min  PT Assessment / Plan / Recommendation Clinical Impression  61 yo male s/p R TKA. Pt also has recent injury to R shoulder that occurred in Feb 2013. Pt states he may have to have shoulder replacement-? rotator cuff injury. Pt has been having issues with pain control for knee but appeared to tolerate activity fairly well.     PT Assessment  Patient needs continued PT services    Follow Up Recommendations  Home health PT    Barriers to Discharge        lEquipment Recommendations   (? need for platform for R UE (per pt's report))    Recommendations for Other Services OT consult   Frequency 7X/week    Precautions / Restrictions Precautions Precautions: Fall;Knee;Shoulder Type of Shoulder Precautions: Pt reports MD has limited R shoulder activity: no overhead activity, no lifting > than 2 lbs. Mentioned platform walker use.  Required Braces or Orthoses: Knee Immobilizer - Right Restrictions Weight Bearing Restrictions: No RLE Weight Bearing: Weight bearing as tolerated   Pertinent Vitals/Pain       Mobility  Bed Mobility Bed Mobility: Supine to Sit Supine to Sit: HOB elevated;With rails;3: Mod assist Details for Bed Mobility Assistance: VCs safety, technique, hand placement. Assist for R LE off bed and for scooting, positioning. Utilized bed pad.  Transfers Transfers: Sit to Stand;Stand to Sit Sit to Stand: From elevated surface;3: Mod assist;From bed Stand to Sit: To chair/3-in-1;With armrests;With upper extremity assist;4: Min assist Details for Transfer Assistance: VCs safety, technique, hand placement. A to rise, stabilize, control descent. Highly elevated surface to assist with limited use of R UE.  Ambulation/Gait Ambulation/Gait Assistance: 4: Min assist Ambulation Distance (Feet): 30  Feet Assistive device: Rolling walker Ambulation/Gait Assistance Details: VCs safety, technique, sequence, posture. Slow gait speed. Pt able to tolerate use of standard RW for ambulation.  Gait Pattern: Step-to pattern;Decreased step length - right;Decreased step length - left;Decreased stride length;Antalgic    Exercises     PT Diagnosis: Difficulty walking;Abnormality of gait;Acute pain  PT Problem List: Decreased strength;Decreased range of motion;Decreased activity tolerance;Decreased mobility;Pain;Decreased knowledge of use of DME PT Treatment Interventions: DME instruction;Gait training;Stair training;Functional mobility training;Therapeutic activities;Therapeutic exercise;Patient/family education   PT Goals Acute Rehab PT Goals PT Goal Formulation: With patient Time For Goal Achievement: 09/25/11 Potential to Achieve Goals: Good Pt will go Supine/Side to Sit: with supervision PT Goal: Supine/Side to Sit - Progress: Goal set today Pt will go Sit to Supine/Side: with supervision PT Goal: Sit to Supine/Side - Progress: Goal set today Pt will go Sit to Stand: with supervision PT Goal: Sit to Stand - Progress: Goal set today Pt will Ambulate: 51 - 150 feet;with supervision;with least restrictive assistive device PT Goal: Ambulate - Progress: Goal set today Pt will Go Up / Down Stairs: 1-2 stairs;with least restrictive assistive device;with min assist (2 steps) PT Goal: Up/Down Stairs - Progress: Goal set today  Visit Information  Last PT Received On: 09/18/11 Assistance Needed: +1    Subjective Data  Subjective: "Its been hurting a lot since last night" Patient Stated Goal: Home   Prior Functioning  Home Living Lives With: Spouse Available Help at Discharge: Family Type of Home: House Home Access: Stairs to enter Secretary/administrator of Steps: 3 Entrance Stairs-Rails: None Home Layout: Two level;Able to live on main level with bedroom/bathroom Home Adaptive Equipment:  Walker - rolling;Bedside commode/3-in-1;Wheelchair - manual;Straight cane;Crutches Prior Function Level of Independence: Independent Able to Take Stairs?: Yes Driving: Yes Communication Communication: No difficulties    Cognition  Overall Cognitive Status: Appears within functional limits for tasks assessed/performed Arousal/Alertness: Awake/alert Orientation Level: Appears intact for tasks assessed Behavior During Session: [facility] for tasks performed    Extremity/Trunk Assessment Right Upper Extremity Assessment RUE ROM/Strength/Tone: Deficits RUE ROM/Strength/Tone Deficits: Limited use/ROM due to recent R shoulder rotator cuff injury. Pt reports MD stated he may have to have shoulder replacement. Right Lower Extremity Assessment RLE ROM/Strength/Tone: Deficits RLE ROM/Strength/Tone Deficits: SLR 2/5, moves ankle.  Knee ext: ~15 degrees in supine Left Lower Extremity Assessment LLE ROM/Strength/Tone: WFL for tasks assessed LLE Coordination: WFL - gross motor Trunk Assessment Trunk Assessment: Normal   Balance    End of Session PT - End of Session Equipment Utilized During Treatment: Gait belt Activity Tolerance: Patient limited by pain;Patient limited by fatigue Patient left: in chair;with call bell/phone within reach   [name] [facility] 09/18/2011, 11:34 AM [phone]

## 2011-09-18 NOTE — Op Note (Signed)
NAMESAMMY, CASSAR NO.:  [id number]  MEDICAL RECORD NO.:  [id number]  LOCATION:  1619                         FACILITY:  [facility]  PHYSICIAN:  Georges Lynch. Janiyah Beery, M.D.DATE OF BIRTH:  02-13-1951  DATE OF PROCEDURE: DATE OF DISCHARGE:                              OPERATIVE REPORT   SURGEON:  Meila Berke A. [name], M.D.  OPERATIVE ASSISTANT:  Dimitri Ped, PA  PREOPERATIVE DIAGNOSIS:  Severe degenerative arthritis involving the right knee.  POSTOPERATIVE DIAGNOSIS:  Severe degenerative arthritis involving the right knee.  OPERATION:  Right total knee arthroplasty utilizing DePuy system.  All 3 components were cemented.  I utilized a size 4 right posterior cruciate sacrificing the femoral component, a size 4 tibial tray.  The insert was a size 4, 12.5 mm thickness.  The patella was a size 38 with 3 prongs.  PROCEDURE:  Under general anesthesia, routine orthopedic prep and draping of the right lower extremity was carried out.  The [facility] knee holder was used.  At this time, he had 1 g of IV Ancef.  Prior to making an incision, the appropriate time-out was carried out.  Also marked the appropriate right leg in the holding area.  At this time, after the leg was placed in [facility] knee holder, I exsanguinated the leg with the Esmarch and elevated tourniquet to 325 mmHg.  The knee then was flexed. Anterior approach to the knee was carried out.  Bleeders identified and cauterized.  I created 2 flaps and carried out a median parapatellar incision, reflected patella laterally, flexed the knee and did medial lateral meniscectomies, and excised the anterior and posterior cruciate ligaments.  At this time, then went on and made my initial drill hole in the intercondylar notch region and then inserted the canal finder.  I thoroughly irrigated out the femoral canal and next, removed 11 mm thickness off with the next jig used.  That was off the distal femur. Prior to that, I  measured the femur to be a size 4 and noted that we did remove the 11 mm thickness off the distal femur.  I then inserted my next jig into the anterior-posterior chamfering cuts.  Following that, I then prepared the tibia in the usual fashion.  I utilized the inter medullary guide for that as well.  I did irrigate this tibial canal as well.  I then removed 6 mm thickness off the affected medial side after we went through, removing all the spurs posteriorly and also after utilizing the spacer devices for tension in medial-lateral tensions and flexion extension.  Following that, I then cut my keel cut out of the tibia, in the usual fashion.  We did utilize a size 4 tray.  Following that, I went up and did my notch cut out of the distal femur, thoroughly irrigated out the area, inserted my trial components.  First, tried a 10 mm thickness insert, it was too thin.  I went ahead and then inserted 12.5 mm thickness insert that fit anatomically and good stability. Following that, the knee was extended with the trial components in place.  I then went on and did my resurfacing procedure on the patella. The patella was extremely  hard.  The bone was very hard.  Three drill holes were made in the patella after we measured the patella be a size 38.  I then removed all trial components, thoroughly water-picked out the knee, cemented all 3 components in simultaneously.  I inserted some thrombin-soaked Gelfoam posteriorly for hemostasis purposes.  I then went on and made sure all loose pieces of cement were removed.  We went through the trials again and finally selected a 12.5 mm thickness insert.  I then removed the trial insert and then inserted my permanent rotating platform, insert size 412.5 mm thickness; reduced the knee; took the knee through motion. Again, we had good flexion, good extension, and good medial lateral stability.  I then inserted a Hemovac drain, closed the knee in layers in usual  fashion.          ______________________________ Georges Lynch [name], M.D.     RAG/MEDQ  D:  09/17/2011  T:  09/18/2011  Job:  [id number]

## 2011-09-18 NOTE — Progress Notes (Signed)
Physical Therapy Treatment Patient Details Name: Levi Turner MRN: [id number] DOB: 1950/12/05 Today's Date: 09/18/2011 Time: 4098-1191 PT Time Calculation (min): 34 min  PT Assessment / Plan / Recommendation Comments on Treatment Session  Pain control better this p.m. Progressing fairly well.     Follow Up Recommendations  Home health PT    Barriers to Discharge        Equipment Recommendations  None recommended by PT    Recommendations for Other Services OT consult  Frequency 7X/week   Plan Discharge plan remains appropriate    Precautions / Restrictions Precautions Precautions: Fall;Knee Type of Shoulder Precautions: Pt reports MD has limited R shoulder activity: no overhead activity, no lifting > than 2 lbs. .  Required Braces or Orthoses: Knee Immobilizer - Right Restrictions Weight Bearing Restrictions: No RLE Weight Bearing: Weight bearing as tolerated   Pertinent Vitals/Pain     Mobility  Bed Mobility Sit to Supine: 4: Min assist;HOB flat Details for Bed Mobility Assistance: A for R LE onto bed. Transfers Transfers: Sit to Stand;Stand to Sit Sit to Stand: 4: Min assist;From chair/3-in-1;With upper extremity assist Stand to Sit: To bed;4: Min assist;With upper extremity assist Details for Transfer Assistance: VCs safety, technique, hand placement. Assist to rise, stabilize, control descent. Ambulation/Gait Ambulation/Gait Assistance: 4: Min guard Ambulation Distance (Feet): 70 Feet Assistive device: Rolling walker Ambulation/Gait Assistance Details: VCs safety, sequence. Slow gait speed. Pt reports better pain control this 2nd session. Gait Pattern: Step-to pattern;Decreased step length - right;Decreased stride length;Antalgic    Exercises Total Joint Exercises Ankle Circles/Pumps: AROM;Both;15 reps;Supine Quad Sets: AROM;Right;10 reps;Supine Short Arc Quad: AAROM;Right;10 reps;Supine Heel Slides: AAROM;Right;10 reps;Supine Hip ABduction/ADduction:  AAROM;Right;Supine;10 reps Straight Leg Raises: AAROM;Right;10 reps;Supine   PT Diagnosis: Difficulty walking;Abnormality of gait;Acute pain  PT Problem List: Decreased strength;Decreased range of motion;Decreased activity tolerance;Decreased mobility;Pain;Decreased knowledge of use of DME PT Treatment Interventions: DME instruction;Gait training;Stair training;Functional mobility training;Therapeutic activities;Therapeutic exercise;Patient/family education   PT Goals Acute Rehab PT Goals PT Goal Formulation: With patient Time For Goal Achievement: 09/25/11 Potential to Achieve Goals: Good Pt will go Supine/Side to Sit: with supervision PT Goal: Supine/Side to Sit - Progress: Goal set today Pt will go Sit to Supine/Side: with supervision PT Goal: Sit to Supine/Side - Progress: Progressing toward goal Pt will go Sit to Stand: with supervision PT Goal: Sit to Stand - Progress: Progressing toward goal Pt will Ambulate: 51 - 150 feet;with supervision;with least restrictive assistive device PT Goal: Ambulate - Progress: Progressing toward goal Pt will Go Up / Down Stairs: 1-2 stairs;with least restrictive assistive device;with min assist (2 steps) PT Goal: Up/Down Stairs - Progress: Goal set today  Visit Information  Last PT Received On: 09/18/11 Assistance Needed: +1    Subjective Data  Subjective: "It's better now" Patient Stated Goal: Home. Pain controlled   Cognition  Overall Cognitive Status: Appears within functional limits for tasks assessed/performed Arousal/Alertness: Awake/alert Orientation Level: Appears intact for tasks assessed Behavior During Session: [facility] for tasks performed    Balance     End of Session PT - End of Session Equipment Utilized During Treatment: Gait belt;Right knee immobilizer Activity Tolerance: Patient tolerated treatment well Patient left: in bed;with call bell/phone within reach    [name] [facility] 09/18/2011, 2:48 PM [phone]

## 2011-09-18 NOTE — Progress Notes (Signed)
Subjective: 1 Day Post-Op Procedure(s) (LRB): TOTAL KNEE ARTHROPLASTY (Right) Patient reports pain as moderate.   Patient seen in rounds with Dr. [name]. Patient is well, but has had some complaints of pain in the right knee, requiring pain medications. He was able to get some sleep last night. No chest pain or shortness of breath. We will start therapy today. Plan is to go Home after hospital stay.  Objective: Vital signs in last 24 hours: Temp:  [98 F (36.7 C)-100.8 F (38.2 C)] 99.9 F (37.7 C) (06/13 0626) Pulse Rate:  [68-100] 76  (06/13 0626) Resp:  [14-27] 16  (06/13 0626) BP: (108-139)/(58-74) 131/70 mmHg (06/13 0626) SpO2:  [98 %-100 %] 99 % (06/13 0626) Weight:  [75.297 kg (166 lb)] 75.297 kg (166 lb) (06/12 1425)  Intake/Output from previous day:  Intake/Output Summary (Last 24 hours) at 09/18/11 0709 Last data filed at 09/18/11 1324  Gross per 24 hour  Intake 3696.67 ml  Output   2800 ml  Net 896.67 ml    Intake/Output this shift:    Labs:  Basename 09/18/11 0504  HGB 11.2*    Basename 09/18/11 0504  WBC 8.4  RBC 3.88*  HCT 34.1*  PLT 257    Basename 09/18/11 0504  NA 135  K 3.9  CL 99  CO2 27  BUN 8  CREATININE 0.74  GLUCOSE 134*  CALCIUM 8.9    EXAM General - Patient is Alert and Oriented Extremity - Neurologically intact Neurovascular intact Dorsiflexion/Plantar flexion intact Dressing - dressing C/D/I Motor Function - intact, moving foot and toes well on exam.  Hemovac pulled without difficulty.  Past Medical History  Diagnosis Date  . Complication of anesthesia     "HARD TO WAKE UP"  . Hypertension   . Arthritis   . Shoulder pain     RIGHT  . Difficulty sleeping   . Ulcerative colitis     recieves IV Remicade for treatment  . Sleep apnea     STOP BANG SCORE 6    Assessment/Plan: 1 Day Post-Op Procedure(s) (LRB): TOTAL KNEE ARTHROPLASTY (Right) Active Problems:  Osteoarthritis of knee  S/P total knee  arthroplasty   Advance diet Up with therapy DC foley when up DVT Prophylaxis - Xarelto and SCDs Weight-Bearing as tolerated to right leg D/C O2 and Pulse OX and try on Room Air Tentative day for discharge Saturday  Levi Turner LAUREN 09/18/2011, 7:09 AM

## 2011-09-19 ENCOUNTER — Encounter (HOSPITAL_COMMUNITY): Payer: Self-pay | Admitting: Orthopedic Surgery

## 2011-09-19 LAB — CBC
HCT: 31.6 % — ABNORMAL LOW (ref 39.0–52.0)
MCV: 88 fL (ref 78.0–100.0)
RBC: 3.59 MIL/uL — ABNORMAL LOW (ref 4.22–5.81)
RDW: 13.6 % (ref 11.5–15.5)
WBC: 11.8 10*3/uL — ABNORMAL HIGH (ref 4.0–10.5)

## 2011-09-19 LAB — BASIC METABOLIC PANEL
BUN: 8 mg/dL (ref 6–23)
CO2: 27 mEq/L (ref 19–32)
Chloride: 99 mEq/L (ref 96–112)
Creatinine, Ser: 0.7 mg/dL (ref 0.50–1.35)

## 2011-09-19 MED ORDER — METHOCARBAMOL 500 MG PO TABS: 500.0000 mg | ORAL_TABLET | Freq: Four times a day (QID) | ORAL | Status: AC | PRN

## 2011-09-19 MED ORDER — RIVAROXABAN 10 MG PO TABS: 10.0000 mg | ORAL_TABLET | Freq: Every day | ORAL | Status: DC

## 2011-09-19 MED ORDER — OXYCODONE HCL 5 MG PO TABS: 5.0000 mg | ORAL_TABLET | ORAL | Status: AC | PRN

## 2011-09-19 MED ORDER — OXYCODONE HCL 5 MG PO TABS
5.0000 mg | ORAL_TABLET | ORAL | Status: DC | PRN
  Administered 2011-09-19 – 2011-09-20 (×8): 10 mg via ORAL
  Filled 2011-09-19 (×8): qty 2

## 2011-09-19 NOTE — Progress Notes (Signed)
Physical Therapy Treatment Patient Details Name: Levi Turner MRN: [id number] DOB: Jul 30, 1950 Today's Date: 09/19/2011 Time: 4098-1191 PT Time Calculation (min): 33 min  PT Assessment / Plan / Recommendation Comments on Treatment Session  Progressing well. Plans for d/c home Sat. Will need to practice steps.    Follow Up Recommendations  Home health PT    Barriers to Discharge        Equipment Recommendations  None recommended by PT    Recommendations for Other Services    Frequency 7X/week   Plan Discharge plan remains appropriate    Precautions / Restrictions Precautions Precautions: Knee Required Braces or Orthoses: Knee Immobilizer - Right Knee Immobilizer - Right: Discontinue once straight leg raise with < 10 degree lag Restrictions Weight Bearing Restrictions: No RLE Weight Bearing: Weight bearing as tolerated   Pertinent Vitals/Pain     Mobility  Bed Mobility Bed Mobility: Sit to Supine Sit to Supine: 4: Min assist Details for Bed Mobility Assistance: A for R LE onto bed. Transfers Transfers: Sit to Stand;Stand to Sit Sit to Stand: 4: Min guard;With upper extremity assist;From chair/3-in-1 Stand to Sit: 4: Min guard;With upper extremity assist;To bed Details for Transfer Assistance: VCs safety, technique, hand placement. Ambulation/Gait Ambulation/Gait Assistance: 4: Min guard Ambulation Distance (Feet): 115 Feet Assistive device: Rolling walker Ambulation/Gait Assistance Details: Slow gait speed. VCs posture, to increase step length Gait Pattern: Decreased stride length;Decreased step length - right;Step-to pattern;Antalgic    Exercises Total Joint Exercises Ankle Circles/Pumps: AROM;Both;10 reps;Supine Quad Sets: AROM;Right;10 reps;Supine Short Arc Quad: AAROM;Right;10 reps;Supine Heel Slides: AAROM;Right;10 reps;Supine Hip ABduction/ADduction: AAROM;Right;10 reps;Supine Straight Leg Raises: AAROM;Right;10 reps;Supine   PT Diagnosis:    PT  Problem List:   PT Treatment Interventions:     PT Goals Acute Rehab PT Goals PT Goal: Sit to Supine/Side - Progress: Progressing toward goal PT Goal: Sit to Stand - Progress: Progressing toward goal PT Goal: Ambulate - Progress: Progressing toward goal  Visit Information  Last PT Received On: 09/19/11 Assistance Needed: +1    Subjective Data  Subjective: "Very little pain" Patient Stated Goal: Home Sat   Cognition  Overall Cognitive Status: Appears within functional limits for tasks assessed/performed Arousal/Alertness: Awake/alert Orientation Level: Appears intact for tasks assessed Behavior During Session: [name] for tasks performed    Balance     End of Session PT - End of Session Equipment Utilized During Treatment: Gait belt;Right knee immobilizer Activity Tolerance: Patient tolerated treatment well Patient left: in bed;with call bell/phone within reach;with family/visitor present    Levi Turner 09/19/2011, 2:50 PM [phone]

## 2011-09-19 NOTE — Evaluation (Signed)
Occupational Therapy Evaluation Patient Details Name: Levi Turner MRN: [id number] DOB: 1950/11/20 Today's Date: 09/19/2011 Time: 4098-1191 OT Time Calculation (min): 33 min  OT Assessment / Plan / Recommendation Clinical Impression  This 61 year old man was admitted with R TKA.  He also has a problem with the R shoulder and is limited with lifting and overhead activities per MD.  All education was completed, and wife will assist him at home.  No further OT needs at this time.      OT Assessment  Patient does not need any further OT services    Follow Up Recommendations  No OT follow up    Barriers to Discharge      Equipment Recommendations  None recommended by OT;None recommended by PT    Recommendations for Other Services    Frequency       Precautions / Restrictions Precautions Precautions: Fall;Knee Restrictions RLE Weight Bearing: Weight bearing as tolerated   Pertinent Vitals/Pain R Knee "2". Repositioned and ice provided    ADL  Eating/Feeding: Simulated;Independent Where Assessed - Eating/Feeding: Chair Grooming: Performed;Teeth care;Supervision/safety Where Assessed - Grooming: Supported standing Upper Body Bathing: Simulated;Set up Where Assessed - Upper Body Bathing: Unsupported sitting Lower Body Bathing: Simulated;Minimal assistance Where Assessed - Lower Body Bathing: Supported sit to stand Upper Body Dressing: Simulated;Set up Where Assessed - Upper Body Dressing: Unsupported sitting Lower Body Dressing: Performed;Moderate assistance (underwear) Where Assessed - Lower Body Dressing: Sopported sit to stand Toilet Transfer: Performed;Min guard Toilet Transfer Method:  (ambulated) Acupuncturist: Raised toilet seat with arms (or 3-in-1 over toilet) Toileting - Clothing Manipulation and Hygiene: Simulated;Min guard Where Assessed - Glass blower/designer Manipulation and Hygiene: Sit to stand from 3-in-1 or toilet Equipment Used: Rolling  walker Transfers/Ambulation Related to ADLs: min guard ADL Comments: educated on tub bench transfer: pt got this from his father    OT Diagnosis:    OT Problem List:   OT Treatment Interventions:     OT Goals    Visit Information  Last OT Received On: 09/19/11 Assistance Needed: +1    Subjective Data  Subjective: "Yes, I do want to practice getting on the toilet" Patient Stated Goal: Get back to fishing and hunting   Prior Functioning  Home Living Lives With: Spouse Bathroom Shower/Tub: Tub/shower unit Bathroom Toilet: Standard (3;1) Home Adaptive Equipment: Tub transfer bench;Bedside commode/3-in-1;Walker - rolling;Straight cane;Crutches Prior Function Level of Independence: Independent Able to Take Stairs?: Yes Driving: Yes Communication Communication: No difficulties Dominant Hand: Right    Cognition  Overall Cognitive Status: Appears within functional limits for tasks assessed/performed Behavior During Session: [facility] for tasks performed    Extremity/Trunk Assessment Right Upper Extremity Assessment RUE ROM/Strength/Tone: Deficits RUE ROM/Strength/Tone Deficits: Limited use/ROM due to recent R shoulder rotator cuff injury. Pt reports MD stated he may have to have shoulder replacement. Left Upper Extremity Assessment LUE ROM/Strength/Tone: Within functional levels   Mobility Bed Mobility Bed Mobility: Supine to Sit Sit to Supine: 4: Min assist;HOB flat;With rail Transfers Sit to Stand: 5: Supervision Details for Transfer Assistance: min cues for hand placement   Exercise    Balance    End of Session OT - End of Session Activity Tolerance: Patient tolerated treatment well Patient left: in chair;with call bell/phone within reach;with family/visitor present   Alisyn Lequire 09/19/2011, 10:09 AM Marica Otter, OTR/L [phone] 09/19/2011

## 2011-09-19 NOTE — Anesthesia Postprocedure Evaluation (Signed)
Anesthesia Post Note  Patient: Levi Turner  Procedure(s) Performed: Procedure(s) (LRB): TOTAL KNEE ARTHROPLASTY (Right)  Anesthesia type: General  Patient location: PACU  Post pain: Pain level controlled  Post assessment: Post-op Vital signs reviewed  Last Vitals:  Filed Vitals:   09/19/11 0646  BP: 148/69  Pulse: 91  Temp: 36.9 C  Resp: 16    Post vital signs: Reviewed  Level of consciousness: sedated  Complications: No apparent anesthesia complications

## 2011-09-19 NOTE — Progress Notes (Signed)
Subjective: 2 Days Post-Op Procedure(s) (LRB): TOTAL KNEE ARTHROPLASTY (Right) Patient reports pain as moderate.   Patient seen in rounds without Dr. [name]. Patient is well, but has had some complaints of constipation and pain in the right knee, requiring pain medications. He is also requesting something for constipation but has not had anything yet. He is not voiding on his own yet but has had the urge to. He reports that therapy went well yesterday. He continues to be hopeful for discharge tomorrow. He denies chest pain and shortness of breath. Plan is to go Home after hospital stay.  Objective: Vital signs in last 24 hours: Temp:  [98.3 F (36.8 C)-99.5 F (37.5 C)] 98.4 F (36.9 C) (06/14 0646) Pulse Rate:  [76-95] 91  (06/14 0646) Resp:  [16] 16  (06/14 0646) BP: (124-148)/(66-72) 148/69 mmHg (06/14 0646) SpO2:  [95 %-100 %] 99 % (06/14 0646)  Intake/Output from previous day:  Intake/Output Summary (Last 24 hours) at 09/19/11 0723 Last data filed at 09/19/11 0500  Gross per 24 hour  Intake   1900 ml  Output   2400 ml  Net   -500 ml    Intake/Output this shift:    Labs:  Basename 09/19/11 0415 09/18/11 0504  HGB 10.4* 11.2*    Basename 09/19/11 0415 09/18/11 0504  WBC 11.8* 8.4  RBC 3.59* 3.88*  HCT 31.6* 34.1*  PLT 227 257    Basename 09/19/11 0415 09/18/11 0504  NA 134* 135  K 3.8 3.9  CL 99 99  CO2 27 27  BUN 8 8  CREATININE 0.70 0.74  GLUCOSE 153* 134*  CALCIUM 9.2 8.9    EXAM General - Patient is Alert and Oriented Extremity - Neurologically intact Neurovascular intact Dorsiflexion/Plantar flexion intact Incision: no drainage Dressing/Incision - clean, dry, no drainage. Dressing changed Motor Function - intact, moving foot and toes well on exam.   Past Medical History  Diagnosis Date  . Complication of anesthesia     "HARD TO WAKE UP"  . Hypertension   . Arthritis   . Shoulder pain     RIGHT  . Difficulty sleeping   . Ulcerative  colitis     recieves IV Remicade for treatment  . Sleep apnea     STOP BANG SCORE 6    Assessment/Plan: 2 Days Post-Op Procedure(s) (LRB): TOTAL KNEE ARTHROPLASTY (Right) Active Problems:  Osteoarthritis of knee  S/P total knee arthroplasty   Advance diet Up with therapy D/C IV fluids Plan for discharge tomorrow DC foley  Will switch from Percocet to Oxy IR to reduce acetaminophen totals Will order shower seat for home  DVT Prophylaxis - Xarelto and SCDs Weight-Bearing as tolerated to right leg  Emonnie Cannady LAUREN 09/19/2011, 7:23 AM

## 2011-09-19 NOTE — Progress Notes (Signed)
Physical Therapy Treatment Patient Details Name: Levi Turner MRN: [id number] DOB: 07-11-1950 Today's Date: 09/19/2011 Time: 7829-5621 PT Time Calculation (min): 27 min  PT Assessment / Plan / Recommendation Comments on Treatment Session  Progressing well.     Follow Up Recommendations  Home health PT    Barriers to Discharge        Equipment Recommendations  None recommended by PT    Recommendations for Other Services    Frequency 7X/week   Plan Discharge plan remains appropriate    Precautions / Restrictions Precautions Precautions: Knee Required Braces or Orthoses: Knee Immobilizer - Right Restrictions Weight Bearing Restrictions: No RLE Weight Bearing: Weight bearing as tolerated   Pertinent Vitals/Pain     Mobility  Bed Mobility Bed Mobility: Not assessed Sit to Supine: 4: Min assist;HOB flat;With rail Transfers Transfers: Sit to Stand;Stand to Sit Sit to Stand: 4: Min guard Stand to Sit: 4: Min assist Details for Transfer Assistance: VCs safety, hand placement, technique.  Ambulation/Gait Ambulation/Gait Assistance: 4: Min guard Ambulation Distance (Feet): 90 Feet Assistive device: Rolling walker Ambulation/Gait Assistance Details: VCS safety, sequence, posture, for pt to increase step length. Slow gait speed.  Gait Pattern: Step-to pattern;Decreased step length - right;Decreased step length - left;Decreased stride length;Antalgic    Exercises     PT Diagnosis:    PT Problem List:   PT Treatment Interventions:     PT Goals Acute Rehab PT Goals PT Goal: Sit to Stand - Progress: Progressing toward goal PT Goal: Ambulate - Progress: Progressing toward goal  Visit Information  Last PT Received On: 09/19/11 Assistance Needed: +1    Subjective Data  Subjective: "Last night the pain was rough" Patient Stated Goal: Home Sat   Cognition  Overall Cognitive Status: Appears within functional limits for tasks assessed/performed Arousal/Alertness:  Awake/Turner Orientation Level: Appears intact for tasks assessed Behavior During Session: [facility] for tasks performed    Balance     End of Session PT - End of Session Equipment Utilized During Treatment: Gait belt;Right knee immobilizer Activity Tolerance: Patient tolerated treatment well Patient left: in chair;with call bell/phone within reach;with family/visitor present    Levi Turner [facility] 09/19/2011, 10:17 AM [phone]

## 2011-09-20 LAB — CBC
HCT: 28.9 % — ABNORMAL LOW (ref 39.0–52.0)
MCH: 28.9 pg (ref 26.0–34.0)
MCV: 87.8 fL (ref 78.0–100.0)
Platelets: 227 10*3/uL (ref 150–400)
RBC: 3.29 MIL/uL — ABNORMAL LOW (ref 4.22–5.81)
RDW: 13.5 % (ref 11.5–15.5)

## 2011-09-20 NOTE — Progress Notes (Signed)
Physical Therapy Treatment Patient Details Name: Levi Turner MRN: [id number] DOB: Oct 02, 1950 Today's Date: 09/20/2011 Time: 4098-1191 PT Time Calculation (min): 30 min  PT Assessment / Plan / Recommendation Comments on Treatment Session  Practiced up/down steps backward using RW and spouse assited under direction of therapist.  Pt ready for D/C to home.    Follow Up Recommendations  Home health PT    Barriers to Discharge        Equipment Recommendations  None recommended by PT    Recommendations for Other Services    Frequency     Plan Discharge plan remains appropriate    Precautions / Restrictions Precautions Precautions: Knee Type of Shoulder Precautions: Pt reports MD has limited R shoulder activity: no overhead activity, no lifting > than 2 lbs. Precaution Comments: Pt and spouse instructed on KI use and when to D/C. Required Braces or Orthoses: Knee Immobilizer - Right Knee Immobilizer - Right: Discontinue once straight leg raise with < 10 degree lag   Pertinent Vitals/Pain C/o 4/10 Knee pain with activity    Mobility  Bed Mobility Bed Mobility: Supine to Sit Supine to Sit: 4: Min assist Details for Bed Mobility Assistance: Min assist to support R LE off bed Transfers Transfers: Sit to Stand;Stand to Sit Sit to Stand: From bed;5: Supervision Stand to Sit: 5: Supervision;To chair/3-in-1 Details for Transfer Assistance: increased time Ambulation/Gait Ambulation/Gait Assistance: 5: Supervision Ambulation Distance (Feet): 110 Feet Assistive device: Rolling walker Ambulation/Gait Assistance Details: Slow gait speed. VCs posture, to increase step length  Gait Pattern: Step-to pattern;Decreased stance time - right Gait velocity: amb with spouse     PT Goals       progressing  Visit Information  Last PT Received On: 09/20/11 Assistance Needed: +1                   End of Session PT - End of Session Equipment Utilized During Treatment: Gait  belt;Right knee immobilizer Activity Tolerance: Patient tolerated treatment well Patient left: in chair;with call bell/phone within reach;with family/visitor present Nurse Communication: Other (comment) (Pt ready for D/C to home)    [name]  PTA [facility]  Acute  Rehab Pager     [phone]

## 2011-09-20 NOTE — Progress Notes (Signed)
Discharged to family auto via wheelchair. Assessment unchanged from am. 

## 2011-09-20 NOTE — Progress Notes (Signed)
Subjective: Feeling good , no BM yet   Objective: Vital signs in last 24 hours: Temp:  [97.9 F (36.6 C)-99 F (37.2 C)] 99 F (37.2 C) (06/15 0450) Pulse Rate:  [89-107] 101  (06/15 0450) Resp:  [16] 16  (06/15 0450) BP: (119-124)/(55-68) 119/64 mmHg (06/15 0450) SpO2:  [97 %-100 %] 100 % (06/15 0450)  Intake/Output from previous day: 06/14 0701 - 06/15 0700 In: 1060 [P.O.:600; I.V.:460] Out: 1900 [Urine:1900] Intake/Output this shift: Total I/O In: 480 [P.O.:480] Out: -    Basename 09/20/11 0530 09/19/11 0415 09/18/11 0504  HGB 9.5* 10.4* 11.2*    Basename 09/20/11 0530 09/19/11 0415  WBC 9.1 11.8*  RBC 3.29* 3.59*  HCT 28.9* 31.6*  PLT 227 227    Basename 09/19/11 0415 09/18/11 0504  NA 134* 135  K 3.8 3.9  CL 99 99  CO2 27 27  BUN 8 8  CREATININE 0.70 0.74  GLUCOSE 153* 134*  CALCIUM 9.2 8.9   No results found for this basename: LABPT:2,INR:2 in the last 72 hours  Cons alert pprop, abd soft +BS, right knee wound clean and dry, no drainage , calf soft and NT foot NMVI  Assessment/Plan: PO Right TKA doing well will D/C Home, contine use colace and miralax on D/C for BM   Levi Turner 09/20/2011, 9:35 AM

## 2011-09-22 NOTE — Discharge Summary (Signed)
Physician Discharge Summary   Patient ID: Levi Turner MRN: [id number] DOB/AGE: February 13, 1951 61 y.o.  Admit date: 09/17/2011 Discharge date: 09/20/2011  Primary Diagnosis:  Osteoarthritis, knee  Admission Diagnoses:  Past Medical History  Diagnosis Date  . Complication of anesthesia     "HARD TO WAKE UP"  . Hypertension   . Arthritis   . Shoulder pain     RIGHT  . Difficulty sleeping   . Ulcerative colitis     recieves IV Remicade for treatment  . Sleep apnea     STOP BANG SCORE 6   Discharge Diagnoses:   Active Problems:  Osteoarthritis of knee  S/P total knee arthroplasty  Procedure: Procedure(s) (LRB): TOTAL KNEE ARTHROPLASTY (Right)   Consults: None  HPI: The patient is a 61 year old male who reports right knee symptoms including pain without any known injury. The patient describes the severity of the symptoms as moderate in severity. Symptoms include knee pain, swelling, stiffness, locking, difficulty bearing weight and difficulty ambulating. The patient describes their pain as sharp. Patient states had arthroscopy years ago. Patient states that he has had the viscosupplementation injections as well. Patient states knee pain is getting worse. Due to failure of conservative measures, the most predictable means for increased function and decreased pain in the right knee is a right total knee arthroplasty.       Laboratory Data: Hospital Outpatient Visit on 09/10/2011  Component Date Value Range Status  . MRSA, PCR 09/10/2011 NEGATIVE  NEGATIVE Final  . Staphylococcus aureus 09/10/2011 NEGATIVE  NEGATIVE Final   Comment:                                 The Xpert SA Assay (FDA                          approved for NASAL specimens                          only), is one component of                          a comprehensive surveillance                          program.  It is not intended                          to diagnose infection nor to       guide or monitor treatment.  Marland Kitchen aPTT 09/10/2011 34  24 - 37 seconds Final  . WBC 09/10/2011 5.2  4.0 - 10.5 K/uL Final  . RBC 09/10/2011 4.68  4.22 - 5.81 MIL/uL Final  . Hemoglobin 09/10/2011 13.7  13.0 - 17.0 g/dL Final  . HCT 40/98/1191 41.3  39.0 - 52.0 % Final  . MCV 09/10/2011 88.2  78.0 - 100.0 fL Final  . MCH 09/10/2011 29.3  26.0 - 34.0 pg Final  . MCHC 09/10/2011 33.2  30.0 - 36.0 g/dL Final  . RDW 47/82/9562 13.6  11.5 - 15.5 % Final  . Platelets 09/10/2011 281  150 - 400 K/uL Final  . Sodium 09/10/2011 138  135 - 145 mEq/L Final  . Potassium 09/10/2011 4.6  3.5 - 5.1 mEq/L Final  .  Chloride 09/10/2011 102  96 - 112 mEq/L Final  . CO2 09/10/2011 27  19 - 32 mEq/L Final  . Glucose, Bld 09/10/2011 106* 70 - 99 mg/dL Final  . BUN 16/01/9603 16  6 - 23 mg/dL Final  . Creatinine, Ser 09/10/2011 0.82  0.50 - 1.35 mg/dL Final  . Calcium 54/12/8117 10.0  8.4 - 10.5 mg/dL Final  . Total Protein 09/10/2011 7.6  6.0 - 8.3 g/dL Final  . Albumin 14/78/2956 4.6  3.5 - 5.2 g/dL Final  . AST 21/30/8657 14  0 - 37 U/L Final  . ALT 09/10/2011 20  0 - 53 U/L Final  . Alkaline Phosphatase 09/10/2011 54  39 - 117 U/L Final  . Total Bilirubin 09/10/2011 0.4  0.3 - 1.2 mg/dL Final  . GFR calc non Af Amer 09/10/2011 >90  >90 mL/min Final  . GFR calc Af Amer 09/10/2011 >90  >90 mL/min Final   Comment:                                 The eGFR has been calculated                          using the CKD EPI equation.                          This calculation has not been                          validated in all clinical                          situations.                          eGFR's persistently                          <90 mL/min signify                          possible Chronic Kidney Disease.  Marland Kitchen Neutrophils Relative 09/10/2011 63  43 - 77 % Final  . Neutro Abs 09/10/2011 3.3  1.7 - 7.7 K/uL Final  . Lymphocytes Relative 09/10/2011 26  12 - 46 % Final  . Lymphs Abs 09/10/2011 1.3  0.7  - 4.0 K/uL Final  . Monocytes Relative 09/10/2011 9  3 - 12 % Final  . Monocytes Absolute 09/10/2011 0.5  0.1 - 1.0 K/uL Final  . Eosinophils Relative 09/10/2011 1  0 - 5 % Final  . Eosinophils Absolute 09/10/2011 0.1  0.0 - 0.7 K/uL Final  . Basophils Relative 09/10/2011 0  0 - 1 % Final  . Basophils Absolute 09/10/2011 0.0  0.0 - 0.1 K/uL Final  . Prothrombin Time 09/10/2011 13.0  11.6 - 15.2 seconds Final  . INR 09/10/2011 0.96  0.00 - 1.49 Final  . Color, Urine 09/10/2011 YELLOW  YELLOW Final  . APPearance 09/10/2011 CLEAR  CLEAR Final  . Specific Gravity, Urine 09/10/2011 1.027  1.005 - 1.030 Final  . pH 09/10/2011 5.0  5.0 - 8.0 Final  . Glucose, UA 09/10/2011 NEGATIVE  NEGATIVE mg/dL Final  . Hgb urine dipstick 09/10/2011 NEGATIVE  NEGATIVE Final  .  Bilirubin Urine 09/10/2011 NEGATIVE  NEGATIVE Final  . Ketones, ur 09/10/2011 NEGATIVE  NEGATIVE mg/dL Final  . Protein, ur 65/78/4696 NEGATIVE  NEGATIVE mg/dL Final  . Urobilinogen, UA 09/10/2011 0.2  0.0 - 1.0 mg/dL Final  . Nitrite 29/52/8413 NEGATIVE  NEGATIVE Final  . Leukocytes, UA 09/10/2011 NEGATIVE  NEGATIVE Final   MICROSCOPIC NOT DONE ON URINES WITH NEGATIVE PROTEIN, BLOOD, LEUKOCYTES, NITRITE, OR GLUCOSE <1000 mg/dL.  . ABO/RH(D) 09/10/2011 AB POS   Final  . Antibody Screen 09/10/2011 NEG   Final  . Sample Expiration 09/10/2011 09/20/2011   Final  . ABO/RH(D) 09/10/2011 AB POS   Final    Basename 09/20/11 0530  HGB 9.5*    Basename 09/20/11 0530  WBC 9.1  RBC 3.29*  HCT 28.9*  PLT 227    X-Rays:Dg Chest 2 View  09/10/2011  *RADIOLOGY REPORT*  Clinical Data: Preop right total knee  CHEST - 2 VIEW  Comparison: 02/02/2010  Findings: Chronic interstitial markings. No pleural effusion or pneumothorax.  Cardiomediastinal silhouette is within normal limits.  Degenerative changes of the visualized thoracolumbar spine.  IMPRESSION: No evidence of acute cardiopulmonary disease.  Original Report Authenticated By: Charline Bills, M.D.   X-ray Knee Right Port  09/17/2011  *RADIOLOGY REPORT*  Clinical Data: Knee replacement.  PORTABLE RIGHT KNEE - 1-2 VIEW  Comparison: None.  Findings: Right total knee replacement is in place.  The device is located and there is no fracture.  Surgical drain and gas in the soft tissues from the procedure noted.  IMPRESSION: Right total knee without evidence of complication.  Original Report Authenticated By: Bernadene Bell. D'ALESSIO, M.D.    EKG: Orders placed during the hospital encounter of 09/17/11  . EKG     Hospital Course: Patient was admitted to [facility] and taken to the OR and underwent the above state procedure without complications.  Patient tolerated the procedure well and was later transferred to the recovery room and then to the orthopaedic floor for postoperative care.  They were given PO and IV analgesics for pain control following their surgery.  They were given 24 hours of postoperative antibiotics and started on DVT prophylaxis in the form of Xarelto.   PT and OT were ordered for total joint protocol.  Discharge planning consulted to help with postop disposition and equipment needs.  Patient had a decent night on the evening of surgery and started to get up OOB with therapy on day one. Hemovac drain was pulled without difficulty.  Continued to work with therapy into day two.  Dressing was changed on day two and the incision was clean, dry, with no drainage.  By day three, the patient had progressed with therapy and meeting their goals.  Incision was healing well.  Patient was seen in rounds and was ready to go home. Continued to have constipation but sent home on Colace and Miralax.  Discharge Medications: Prior to Admission medications   Medication Sig Start Date End Date Taking? Authorizing Provider  amLODipine (NORVASC) 5 MG tablet Take 5 mg by mouth at bedtime.   Yes Historical Provider, MD  lisinopril (PRINIVIL,ZESTRIL) 20 MG tablet Take 40 mg by mouth at  bedtime.   Yes Historical Provider, MD  mercaptopurine (PURINETHOL) 50 MG tablet Take 50 mg by mouth daily before breakfast. Give on an empty stomach 1 hour before or 2 hours after meals. Caution: Chemotherapy.   Yes Historical Provider, MD  mesalamine (LIALDA) 1.2 G EC tablet Take 4,800 mg by mouth  daily with breakfast.   Yes Historical Provider, MD  simvastatin (ZOCOR) 20 MG tablet Take 20 mg by mouth every evening.   Yes Historical Provider, MD  ferrous sulfate 325 (65 FE) MG EC tablet Take 1,300 mg by mouth daily with breakfast.    Historical Provider, MD  methocarbamol (ROBAXIN) 500 MG tablet Take 1 tablet (500 mg total) by mouth every 6 (six) hours as needed. 09/19/11 09/29/11  Brittnay Pigman Tamala Ser, PA  oxyCODONE (OXY IR/ROXICODONE) 5 MG immediate release tablet Take 1-3 tablets (5-15 mg total) by mouth every 3 (three) hours as needed. 09/19/11 09/29/11  Crystall Donaldson Tamala Ser, PA  rivaroxaban (XARELTO) 10 MG TABS tablet Take 1 tablet (10 mg total) by mouth daily with breakfast. 09/19/11   Deedra Pro Tamala Ser, PA    Diet: Regular diet Activity:WBAT Follow-up:in 2 weeks Disposition - Home Discharged Condition: good   Discharge Orders    Future Appointments: Provider: Department: Dept Phone: Center:   10/20/2011 8:00 AM [facility]  None     Future Orders Please Complete By Expires   Diet - low sodium heart healthy      Diet general      Call MD / Call 911      Comments:   If you experience chest pain or shortness of breath, CALL 911 and be transported to the hospital emergency room.  If you develope a fever above 101 F, pus (white drainage) or increased drainage or redness at the wound, or calf pain, call your surgeon's office.   Constipation Prevention      Comments:   Drink plenty of fluids.  Prune juice may be helpful.  You may use a stool softener, such as Colace (over the counter) 100 mg twice a day.  Use MiraLax (over the counter) for constipation as  needed.   Increase activity slowly as tolerated      Discharge instructions      Comments:   Walk with your walker. Weight bearing as instructed. Home Health Agency will follow you at home for your therapy  Change your dressing daily. Shower only, no tub bath. Call if any temperatures greater than 101 or any wound complications: [phone] during the day and ask for Dr. Jeannetta Ellis nurse, [facility]. Resume supplements and vitamins after completion of the Xarelto   Driving restrictions      Comments:   No driving   Change dressing      Comments:   Change dressing daily with sterile 4 x 4 inch gauze dressing   Do not put a pillow under the knee. Place it under the heel.      Call MD / Call 911      Comments:   If you experience chest pain or shortness of breath, CALL 911 and be transported to the hospital emergency room.  If you develope a fever above 101 F, pus (white drainage) or increased drainage or redness at the wound, or calf pain, call your surgeon's office.   Increase activity slowly as tolerated      Discharge instructions      Comments:   Call [phone] for follow up with Dr [name] in two weeks. Pick up colace and miralax at pharmacy for constipation with Pain meds.   TED hose      Comments:   Use stockings (TED hose) for 3 weeks on both leg(s).  You may remove them at night for sleeping.   Change dressing  Comments:   Change dressing  dressing daily with sterile 4 x 4 inch gauze dressing and apply TED hose.  You may clean the incision with alcohol prior to redressing.   Do not put a pillow under the knee. Place it under the heel.        Medication List  As of 09/22/2011 12:24 PM   STOP taking these medications         acetaminophen 500 MG tablet      ACIDOPHILUS PO      Calcium Carbonate-Vitamin D 600-400 MG-UNIT per tablet      diphenhydramine-acetaminophen 25-500 MG Tabs      Glucosamine Sulfate 1000 MG Tabs      HYDROcodone-acetaminophen 5-325 MG per  tablet      MAGNESIUM CARBONATE PO      Melatonin 10 MG Tabs      multivitamin with minerals Tabs      potassium gluconate 595 MG Tabs      Saw Palmetto (Serenoa repens) 450 MG Caps      vitamin C 500 MG tablet      Vitamin D 2000 UNITS tablet      vitamin E 400 UNIT capsule         TAKE these medications         amLODipine 5 MG tablet   Commonly known as: NORVASC   Take 5 mg by mouth at bedtime.      ferrous sulfate 325 (65 FE) MG EC tablet   Take 1,300 mg by mouth daily with breakfast.      LIALDA 1.2 G EC tablet   Generic drug: mesalamine   Take 4,800 mg by mouth daily with breakfast.      lisinopril 20 MG tablet   Commonly known as: PRINIVIL,ZESTRIL   Take 40 mg by mouth at bedtime.      mercaptopurine 50 MG tablet   Commonly known as: PURINETHOL   Take 50 mg by mouth daily before breakfast. Give on an empty stomach 1 hour before or 2 hours after meals. Caution: Chemotherapy.      methocarbamol 500 MG tablet   Commonly known as: ROBAXIN   Take 1 tablet (500 mg total) by mouth every 6 (six) hours as needed.      oxyCODONE 5 MG immediate release tablet   Commonly known as: Oxy IR/ROXICODONE   Take 1-3 tablets (5-15 mg total) by mouth every 3 (three) hours as needed.      rivaroxaban 10 MG Tabs tablet   Commonly known as: XARELTO   Take 1 tablet (10 mg total) by mouth daily with breakfast.      simvastatin 20 MG tablet   Commonly known as: ZOCOR   Take 20 mg by mouth every evening.             SignedCeledonio Savage, Neville Pauls LAUREN 09/22/2011, 12:24 PM

## 2011-10-17 ENCOUNTER — Other Ambulatory Visit (HOSPITAL_COMMUNITY): Payer: Self-pay | Admitting: *Deleted

## 2011-10-17 MED ORDER — ACETAMINOPHEN 325 MG PO TABS: 650.0000 mg | ORAL_TABLET | ORAL | Status: DC

## 2011-10-17 MED ORDER — SODIUM CHLORIDE 0.9 % IV SOLN: INTRAVENOUS | Status: DC

## 2011-10-17 MED ORDER — SODIUM CHLORIDE 0.9 % IV SOLN: 5.0000 mg/kg | INTRAVENOUS | Status: DC

## 2011-10-17 MED ORDER — DIPHENHYDRAMINE HCL 25 MG PO TABS: 25.0000 mg | ORAL_TABLET | ORAL | Status: DC

## 2011-10-20 ENCOUNTER — Encounter (HOSPITAL_COMMUNITY)
Admission: RE | Admit: 2011-10-20 | Discharge: 2011-10-20 | Disposition: A | Discharge status: Home / Self Care | Payer: BC Managed Care – PPO | Source: Ambulatory Visit | Attending: Gastroenterology | Admitting: Gastroenterology

## 2011-10-20 DIAGNOSIS — K5150 Left sided colitis without complications: Secondary | ICD-10-CM | POA: Insufficient documentation

## 2011-10-20 MED ORDER — SODIUM CHLORIDE 0.9 % IV SOLN
INTRAVENOUS | Status: AC
  Administered 2011-10-20: 09:00:00 via INTRAVENOUS

## 2011-10-20 MED ORDER — DIPHENHYDRAMINE HCL 25 MG PO TABS
25.0000 mg | ORAL_TABLET | ORAL | Status: DC
  Filled 2011-10-20: qty 1

## 2011-10-20 MED ORDER — SODIUM CHLORIDE 0.9 % IV SOLN
5.0000 mg/kg | INTRAVENOUS | Status: AC
  Administered 2011-10-20: 400 mg via INTRAVENOUS
  Filled 2011-10-20: qty 40

## 2011-10-20 MED ORDER — ACETAMINOPHEN 325 MG PO TABS: 650.0000 mg | ORAL_TABLET | ORAL | Status: DC

## 2011-12-12 ENCOUNTER — Other Ambulatory Visit (HOSPITAL_COMMUNITY): Payer: Self-pay | Admitting: *Deleted

## 2011-12-15 ENCOUNTER — Encounter (HOSPITAL_COMMUNITY)
Admission: RE | Admit: 2011-12-15 | Discharge: 2011-12-15 | Disposition: A | Discharge status: Home / Self Care | Payer: Medicare Other | Source: Ambulatory Visit | Attending: Gastroenterology | Admitting: Gastroenterology

## 2011-12-15 DIAGNOSIS — K5150 Left sided colitis without complications: Secondary | ICD-10-CM | POA: Insufficient documentation

## 2011-12-17 ENCOUNTER — Encounter (HOSPITAL_COMMUNITY): Payer: Self-pay | Admitting: Pharmacy Technician

## 2011-12-19 ENCOUNTER — Encounter (HOSPITAL_COMMUNITY)
Admission: RE | Admit: 2011-12-19 | Discharge: 2011-12-19 | Disposition: A | Discharge status: Home / Self Care | Payer: Worker's Compensation | Source: Ambulatory Visit | Attending: Orthopedic Surgery | Admitting: Orthopedic Surgery

## 2011-12-19 ENCOUNTER — Encounter (HOSPITAL_COMMUNITY): Payer: Self-pay

## 2011-12-19 HISTORY — DX: Anemia, unspecified: D64.9

## 2011-12-19 HISTORY — DX: Major depressive disorder, single episode, unspecified: F32.9

## 2011-12-19 HISTORY — DX: Hyperlipidemia, unspecified: E78.5

## 2011-12-19 HISTORY — DX: Unspecified glaucoma: H40.9

## 2011-12-19 HISTORY — DX: Depression, unspecified: F32.A

## 2011-12-19 HISTORY — DX: Effusion, unspecified joint: M25.40

## 2011-12-19 HISTORY — DX: Pain in unspecified joint: M25.50

## 2011-12-19 LAB — CBC
HCT: 39.9 % (ref 39.0–52.0)
MCH: 28.3 pg (ref 26.0–34.0)
MCHC: 33.1 g/dL (ref 30.0–36.0)
MCV: 85.4 fL (ref 78.0–100.0)
Platelets: 279 10*3/uL (ref 150–400)
RDW: 15.6 % — ABNORMAL HIGH (ref 11.5–15.5)
WBC: 3.7 10*3/uL — ABNORMAL LOW (ref 4.0–10.5)

## 2011-12-19 LAB — SURGICAL PCR SCREEN
MRSA, PCR: NEGATIVE
Staphylococcus aureus: NEGATIVE

## 2011-12-19 LAB — BASIC METABOLIC PANEL
BUN: 11 mg/dL (ref 6–23)
CO2: 26 mEq/L (ref 19–32)
Calcium: 10.1 mg/dL (ref 8.4–10.5)
Chloride: 101 mEq/L (ref 96–112)
Creatinine, Ser: 0.77 mg/dL (ref 0.50–1.35)

## 2011-12-19 LAB — TYPE AND SCREEN

## 2011-12-19 MED ORDER — CHLORHEXIDINE GLUCONATE 4 % EX LIQD: 60.0000 mL | Freq: Once | CUTANEOUS | Status: DC

## 2011-12-19 NOTE — Pre-Procedure Instructions (Signed)
20 Levi Turner  12/19/2011   Your procedure is scheduled on:  Thurs, Sept 19 @ 7:30 AM  Report to [facility] at 5:30 AM.  Call this number if you have problems the morning of surgery: [phone]   Remember:   Do not eat food:After Midnight.    Take these medicines the morning of surgery with A SIP OF WATER: Amlodipine(Norvasc) and Pain Pill(if needed)   Do not wear jewelry  Do not wear lotions, powders, or colognes. You may wear deodorant.  Men may shave face and neck.  Do not bring valuables to the hospital.  Contacts, dentures or bridgework may not be worn into surgery.  Leave suitcase in the car. After surgery it may be brought to your room.  For patients admitted to the hospital, checkout time is 11:00 AM the day of discharge.   Patients discharged the day of surgery will not be allowed to drive home.    Special Instructions: CHG Shower Use Special Wash: 1/2 bottle night before surgery and 1/2 bottle morning of surgery.   Please read over the following fact sheets that you were given: Pain Booklet, Coughing and Deep Breathing, Blood Transfusion Information, MRSA Information and Surgical Site Infection Prevention

## 2011-12-19 NOTE — Progress Notes (Signed)
Saw Dr.Skains in 2011 but hasn't seen him since;saw him b/c sharp pain in shoulder(muslce separating from bone)  Echo in epic from 2011 Heart cath in epic from 2011 Denies ever having a heart cath  Medical MD is Dr.Kevin Little  EKG and CXR in epic from 09/10/11

## 2011-12-24 MED ORDER — CEFAZOLIN SODIUM-DEXTROSE 2-3 GM-% IV SOLR
2.0000 g | INTRAVENOUS | Status: AC
  Administered 2011-12-25: 2 g via INTRAVENOUS
  Filled 2011-12-24: qty 50

## 2011-12-25 ENCOUNTER — Inpatient Hospital Stay (HOSPITAL_COMMUNITY)
Admission: RE | Admit: 2011-12-25 | Discharge: 2011-12-26 | DRG: 484 | Disposition: A | Discharge status: Home / Self Care | Payer: Worker's Compensation | Source: Ambulatory Visit | Attending: Orthopedic Surgery | Admitting: Orthopedic Surgery

## 2011-12-25 ENCOUNTER — Encounter (HOSPITAL_COMMUNITY)
Admission: RE | Disposition: A | Discharge status: Home / Self Care | Payer: Self-pay | Source: Ambulatory Visit | Attending: Orthopedic Surgery

## 2011-12-25 ENCOUNTER — Encounter (HOSPITAL_COMMUNITY): Payer: Self-pay | Admitting: General Practice

## 2011-12-25 ENCOUNTER — Ambulatory Visit (HOSPITAL_COMMUNITY): Payer: Worker's Compensation | Admitting: Anesthesiology

## 2011-12-25 ENCOUNTER — Encounter (HOSPITAL_COMMUNITY): Payer: Self-pay | Admitting: Anesthesiology

## 2011-12-25 ENCOUNTER — Encounter (HOSPITAL_COMMUNITY): Payer: Self-pay | Admitting: Surgery

## 2011-12-25 DIAGNOSIS — Z96659 Presence of unspecified artificial knee joint: Secondary | ICD-10-CM

## 2011-12-25 DIAGNOSIS — M67919 Unspecified disorder of synovium and tendon, unspecified shoulder: Secondary | ICD-10-CM | POA: Diagnosis present

## 2011-12-25 DIAGNOSIS — Z87891 Personal history of nicotine dependence: Secondary | ICD-10-CM

## 2011-12-25 DIAGNOSIS — M19019 Primary osteoarthritis, unspecified shoulder: Principal | ICD-10-CM | POA: Diagnosis present

## 2011-12-25 DIAGNOSIS — M719 Bursopathy, unspecified: Secondary | ICD-10-CM | POA: Diagnosis present

## 2011-12-25 DIAGNOSIS — M75101 Unspecified rotator cuff tear or rupture of right shoulder, not specified as traumatic: Secondary | ICD-10-CM

## 2011-12-25 HISTORY — PX: REVERSE SHOULDER ARTHROPLASTY: SHX5054

## 2011-12-25 HISTORY — PX: REVERSE TOTAL SHOULDER ARTHROPLASTY: SHX2344

## 2011-12-25 SURGERY — ARTHROPLASTY, SHOULDER, TOTAL, REVERSE
Anesthesia: General | Site: Shoulder | Laterality: Right | Wound class: Clean

## 2011-12-25 MED ORDER — AMLODIPINE BESYLATE 5 MG PO TABS
5.0000 mg | ORAL_TABLET | Freq: Every day | ORAL | Status: DC
  Administered 2011-12-25: 5 mg via ORAL
  Filled 2011-12-25 (×3): qty 1

## 2011-12-25 MED ORDER — LIDOCAINE HCL (CARDIAC) 20 MG/ML IV SOLN
INTRAVENOUS | Status: DC | PRN
  Administered 2011-12-25: 60 mg via INTRAVENOUS

## 2011-12-25 MED ORDER — ATORVASTATIN CALCIUM 40 MG PO TABS
40.0000 mg | ORAL_TABLET | Freq: Every day | ORAL | Status: DC
  Administered 2011-12-25: 40 mg via ORAL
  Filled 2011-12-25 (×2): qty 1

## 2011-12-25 MED ORDER — MIDAZOLAM HCL 5 MG/5ML IJ SOLN
INTRAMUSCULAR | Status: DC | PRN
  Administered 2011-12-25 (×2): 1 mg via INTRAVENOUS

## 2011-12-25 MED ORDER — METOCLOPRAMIDE HCL 10 MG PO TABS: 5.0000 mg | ORAL_TABLET | Freq: Three times a day (TID) | ORAL | Status: DC | PRN

## 2011-12-25 MED ORDER — METHOCARBAMOL 500 MG PO TABS: 500.0000 mg | ORAL_TABLET | Freq: Four times a day (QID) | ORAL | Status: DC | PRN

## 2011-12-25 MED ORDER — MERCAPTOPURINE 50 MG PO TABS
50.0000 mg | ORAL_TABLET | Freq: Every day | ORAL | Status: DC
  Administered 2011-12-26: 50 mg via ORAL
  Filled 2011-12-25 (×2): qty 1

## 2011-12-25 MED ORDER — DOCUSATE SODIUM 100 MG PO CAPS
100.0000 mg | ORAL_CAPSULE | Freq: Two times a day (BID) | ORAL | Status: DC
  Administered 2011-12-25 – 2011-12-26 (×3): 100 mg via ORAL
  Filled 2011-12-25 (×3): qty 1

## 2011-12-25 MED ORDER — MESALAMINE 1.2 G PO TBEC
4800.0000 mg | DELAYED_RELEASE_TABLET | Freq: Every day | ORAL | Status: DC
  Administered 2011-12-26: 4.8 g via ORAL
  Filled 2011-12-25 (×2): qty 4

## 2011-12-25 MED ORDER — LISINOPRIL 40 MG PO TABS
40.0000 mg | ORAL_TABLET | Freq: Every day | ORAL | Status: DC
  Administered 2011-12-25: 40 mg via ORAL
  Filled 2011-12-25 (×2): qty 1

## 2011-12-25 MED ORDER — PHENYLEPHRINE HCL 10 MG/ML IJ SOLN
INTRAMUSCULAR | Status: DC | PRN
  Administered 2011-12-25: 80 ug via INTRAVENOUS

## 2011-12-25 MED ORDER — ROCURONIUM BROMIDE 100 MG/10ML IV SOLN
INTRAVENOUS | Status: DC | PRN
  Administered 2011-12-25: 50 mg via INTRAVENOUS

## 2011-12-25 MED ORDER — METHOCARBAMOL 100 MG/ML IJ SOLN
500.0000 mg | Freq: Four times a day (QID) | INTRAVENOUS | Status: DC | PRN
  Filled 2011-12-25: qty 5

## 2011-12-25 MED ORDER — BUPIVACAINE-EPINEPHRINE PF 0.5-1:200000 % IJ SOLN
INTRAMUSCULAR | Status: DC | PRN
  Administered 2011-12-25: 30 mL

## 2011-12-25 MED ORDER — LACTATED RINGERS IV SOLN: INTRAVENOUS | Status: DC

## 2011-12-25 MED ORDER — KETOROLAC TROMETHAMINE 15 MG/ML IJ SOLN
15.0000 mg | Freq: Four times a day (QID) | INTRAMUSCULAR | Status: DC
  Administered 2011-12-25 – 2011-12-26 (×4): 15 mg via INTRAVENOUS
  Filled 2011-12-25 (×8): qty 1

## 2011-12-25 MED ORDER — DIPHENHYDRAMINE HCL 12.5 MG/5ML PO ELIX: 12.5000 mg | ORAL_SOLUTION | ORAL | Status: DC | PRN

## 2011-12-25 MED ORDER — FENTANYL CITRATE 0.05 MG/ML IJ SOLN
INTRAMUSCULAR | Status: DC | PRN
  Administered 2011-12-25: 50 ug via INTRAVENOUS
  Administered 2011-12-25: 100 ug via INTRAVENOUS

## 2011-12-25 MED ORDER — HYDROMORPHONE HCL PF 1 MG/ML IJ SOLN
INTRAMUSCULAR | Status: AC
  Filled 2011-12-25: qty 2

## 2011-12-25 MED ORDER — ONDANSETRON HCL 4 MG/2ML IJ SOLN
INTRAMUSCULAR | Status: DC | PRN
  Administered 2011-12-25: 4 mg via INTRAVENOUS

## 2011-12-25 MED ORDER — LACTATED RINGERS IV SOLN
INTRAVENOUS | Status: DC | PRN
  Administered 2011-12-25 (×2): via INTRAVENOUS

## 2011-12-25 MED ORDER — OXYCODONE-ACETAMINOPHEN 5-325 MG PO TABS
1.0000 | ORAL_TABLET | ORAL | Status: DC | PRN
  Administered 2011-12-25 – 2011-12-26 (×4): 2 via ORAL
  Filled 2011-12-25 (×4): qty 2

## 2011-12-25 MED ORDER — ACETAMINOPHEN 650 MG RE SUPP: 650.0000 mg | Freq: Four times a day (QID) | RECTAL | Status: DC | PRN

## 2011-12-25 MED ORDER — CEFAZOLIN SODIUM 1-5 GM-% IV SOLN
1.0000 g | Freq: Four times a day (QID) | INTRAVENOUS | Status: AC
  Administered 2011-12-25 – 2011-12-26 (×3): 1 g via INTRAVENOUS
  Filled 2011-12-25 (×4): qty 50

## 2011-12-25 MED ORDER — ACETAMINOPHEN 325 MG PO TABS: 650.0000 mg | ORAL_TABLET | Freq: Four times a day (QID) | ORAL | Status: DC | PRN

## 2011-12-25 MED ORDER — PHENYLEPHRINE HCL 10 MG/ML IJ SOLN
10.0000 mg | INTRAVENOUS | Status: DC | PRN
  Administered 2011-12-25: 25 ug/min via INTRAVENOUS

## 2011-12-25 MED ORDER — LACTATED RINGERS IV SOLN
INTRAVENOUS | Status: DC
  Administered 2011-12-25 – 2011-12-26 (×2): via INTRAVENOUS

## 2011-12-25 MED ORDER — METOCLOPRAMIDE HCL 5 MG/ML IJ SOLN: 5.0000 mg | Freq: Three times a day (TID) | INTRAMUSCULAR | Status: DC | PRN

## 2011-12-25 MED ORDER — INFLUENZA VIRUS VACC SPLIT PF IM SUSP
0.5000 mL | INTRAMUSCULAR | Status: AC
  Administered 2011-12-26: 0.5 mL via INTRAMUSCULAR
  Filled 2011-12-25: qty 0.5

## 2011-12-25 MED ORDER — ONDANSETRON HCL 4 MG PO TABS: 4.0000 mg | ORAL_TABLET | Freq: Four times a day (QID) | ORAL | Status: DC | PRN

## 2011-12-25 MED ORDER — SODIUM CHLORIDE 0.9 % IR SOLN
Status: DC | PRN
  Administered 2011-12-25: 1000 mL

## 2011-12-25 MED ORDER — TEMAZEPAM 15 MG PO CAPS: 15.0000 mg | ORAL_CAPSULE | Freq: Every evening | ORAL | Status: DC | PRN

## 2011-12-25 MED ORDER — ONDANSETRON HCL 4 MG/2ML IJ SOLN: 4.0000 mg | Freq: Four times a day (QID) | INTRAMUSCULAR | Status: DC | PRN

## 2011-12-25 MED ORDER — HYDROMORPHONE HCL PF 1 MG/ML IJ SOLN
0.5000 mg | INTRAMUSCULAR | Status: DC | PRN
  Administered 2011-12-25 – 2011-12-26 (×2): 1 mg via INTRAVENOUS
  Filled 2011-12-25 (×2): qty 1

## 2011-12-25 MED ORDER — PROPOFOL 10 MG/ML IV BOLUS
INTRAVENOUS | Status: DC | PRN
  Administered 2011-12-25: 180 mg via INTRAVENOUS

## 2011-12-25 MED ORDER — GLYCOPYRROLATE 0.2 MG/ML IJ SOLN
INTRAMUSCULAR | Status: DC | PRN
  Administered 2011-12-25: 0.4 mg via INTRAVENOUS

## 2011-12-25 MED ORDER — PHENOL 1.4 % MT LIQD: 1.0000 | OROMUCOSAL | Status: DC | PRN

## 2011-12-25 MED ORDER — HYDROMORPHONE HCL PF 1 MG/ML IJ SOLN
0.2500 mg | INTRAMUSCULAR | Status: DC | PRN
  Administered 2011-12-25 (×4): 0.5 mg via INTRAVENOUS

## 2011-12-25 MED ORDER — NEOSTIGMINE METHYLSULFATE 1 MG/ML IJ SOLN
INTRAMUSCULAR | Status: DC | PRN
  Administered 2011-12-25: 3 mg via INTRAVENOUS

## 2011-12-25 MED ORDER — MENTHOL 3 MG MT LOZG: 1.0000 | LOZENGE | OROMUCOSAL | Status: DC | PRN

## 2011-12-25 SURGICAL SUPPLY — 73 items
BIT DRILL 170X2.5X (BIT) IMPLANT
BIT DRL 170X2.5X (BIT)
BLADE SAW SGTL 83.5X18.5 (BLADE) ×2 IMPLANT
BRUSH FEMORAL CANAL (MISCELLANEOUS) IMPLANT
CEMENT BONE DEPUY (Cement) ×4 IMPLANT
CEMENT RESTRICTOR DEPUY SZ 3 (Cement) ×2 IMPLANT
CLOTH BEACON ORANGE TIMEOUT ST (SAFETY) ×2 IMPLANT
COVER SURGICAL LIGHT HANDLE (MISCELLANEOUS) ×2 IMPLANT
DRAPE INCISE IOBAN 66X45 STRL (DRAPES) ×2 IMPLANT
DRAPE SURG 17X11 SM STRL (DRAPES) ×2 IMPLANT
DRAPE U-SHAPE 47X51 STRL (DRAPES) ×2 IMPLANT
DRILL 2.5 (BIT)
DRILL BIT 7/64X5 (BIT) IMPLANT
DRSG AQUACEL AG ADV 3.5X10 (GAUZE/BANDAGES/DRESSINGS) IMPLANT
DRSG MEPILEX BORDER 4X8 (GAUZE/BANDAGES/DRESSINGS) IMPLANT
DURAPREP 26ML APPLICATOR (WOUND CARE) ×2 IMPLANT
ELECT BLADE 4.0 EZ CLEAN MEGAD (MISCELLANEOUS) ×2
ELECT CAUTERY BLADE 6.4 (BLADE) ×2 IMPLANT
ELECT REM PT RETURN 9FT ADLT (ELECTROSURGICAL) ×2
ELECTRODE BLDE 4.0 EZ CLN MEGD (MISCELLANEOUS) ×1 IMPLANT
ELECTRODE REM PT RTRN 9FT ADLT (ELECTROSURGICAL) ×1 IMPLANT
FACESHIELD LNG OPTICON STERILE (SAFETY) ×6 IMPLANT
GLOVE BIO SURGEON STRL SZ7.5 (GLOVE) ×2 IMPLANT
GLOVE BIO SURGEON STRL SZ8 (GLOVE) ×2 IMPLANT
GLOVE BIO SURGEON STRL SZ8.5 (GLOVE) ×2 IMPLANT
GLOVE BIOGEL PI IND STRL 8 (GLOVE) ×1 IMPLANT
GLOVE BIOGEL PI INDICATOR 8 (GLOVE) ×1
GLOVE EUDERMIC 7 POWDERFREE (GLOVE) ×2 IMPLANT
GLOVE SS BIOGEL STRL SZ 7.5 (GLOVE) ×1 IMPLANT
GLOVE SUPERSENSE BIOGEL SZ 7.5 (GLOVE) ×1
GLOVE SURG SS PI 7.5 STRL IVOR (GLOVE) ×2 IMPLANT
GLOVE SURG SS PI 8.5 STRL IVOR (GLOVE) ×1
GLOVE SURG SS PI 8.5 STRL STRW (GLOVE) ×1 IMPLANT
GOWN STRL NON-REIN LRG LVL3 (GOWN DISPOSABLE) IMPLANT
GOWN STRL REIN XL XLG (GOWN DISPOSABLE) ×10 IMPLANT
HANDPIECE INTERPULSE COAX TIP (DISPOSABLE)
KIT BASIN OR (CUSTOM PROCEDURE TRAY) ×2 IMPLANT
KIT ROOM TURNOVER OR (KITS) ×2 IMPLANT
MANIFOLD NEPTUNE II (INSTRUMENTS) ×2 IMPLANT
NDL SUT 6 .5 CRC .975X.05 MAYO (NEEDLE) ×1 IMPLANT
NEEDLE HYPO 25GX1X1/2 BEV (NEEDLE) IMPLANT
NEEDLE MAYO TAPER (NEEDLE) ×1
NS IRRIG 1000ML POUR BTL (IV SOLUTION) ×2 IMPLANT
PACK SHOULDER (CUSTOM PROCEDURE TRAY) ×2 IMPLANT
PAD ARMBOARD 7.5X6 YLW CONV (MISCELLANEOUS) ×4 IMPLANT
PASSER SUT SWANSON 36MM LOOP (INSTRUMENTS) IMPLANT
PIN GUIDE 1.2 (PIN) IMPLANT
PIN GUIDE GLENOPHERE 1.5MX300M (PIN) IMPLANT
PIN METAGLENE 2.5 (PIN) IMPLANT
PRESSURIZER FEMORAL UNIV (MISCELLANEOUS) IMPLANT
SET HNDPC FAN SPRY TIP SCT (DISPOSABLE) IMPLANT
SLING ARM IMMOBILIZER LRG (SOFTGOODS) IMPLANT
SPONGE LAP 18X18 X RAY DECT (DISPOSABLE) ×2 IMPLANT
SPONGE LAP 4X18 X RAY DECT (DISPOSABLE) IMPLANT
STRIP CLOSURE SKIN 1/2X4 (GAUZE/BANDAGES/DRESSINGS) IMPLANT
SUCTION FRAZIER TIP 10 FR DISP (SUCTIONS) ×2 IMPLANT
SUT BONE WAX W31G (SUTURE) IMPLANT
SUT FIBERWIRE #2 38 T-5 BLUE (SUTURE) ×2
SUT MNCRL AB 3-0 PS2 18 (SUTURE) ×2 IMPLANT
SUT VIC AB 1 CT1 27 (SUTURE) ×1
SUT VIC AB 1 CT1 27XBRD ANBCTR (SUTURE) ×1 IMPLANT
SUT VIC AB 2-0 CT1 27 (SUTURE) ×1
SUT VIC AB 2-0 CT1 TAPERPNT 27 (SUTURE) ×1 IMPLANT
SUT VIC AB 2-0 SH 27 (SUTURE)
SUT VIC AB 2-0 SH 27X BRD (SUTURE) IMPLANT
SUTURE FIBERWR #2 38 T-5 BLUE (SUTURE) ×1 IMPLANT
SYR 30ML SLIP (SYRINGE) ×2 IMPLANT
SYR CONTROL 10ML LL (SYRINGE) IMPLANT
TOWEL OR 17X24 6PK STRL BLUE (TOWEL DISPOSABLE) ×2 IMPLANT
TOWEL OR 17X26 10 PK STRL BLUE (TOWEL DISPOSABLE) ×2 IMPLANT
TOWER CARTRIDGE SMART MIX (DISPOSABLE) ×2 IMPLANT
TRAY FOLEY CATH 14FR (SET/KITS/TRAYS/PACK) IMPLANT
WATER STERILE IRR 1000ML POUR (IV SOLUTION) ×2 IMPLANT

## 2011-12-25 NOTE — Progress Notes (Signed)
UR COMPLETED  

## 2011-12-25 NOTE — Anesthesia Procedure Notes (Addendum)
Anesthesia Regional Block:  Interscalene brachial plexus block  Pre-Anesthetic Checklist: ,, timeout performed, Correct Patient, Correct Site, Correct Laterality, Correct Procedure, Correct Position, site marked, Risks and benefits discussed,  Surgical consent,  Pre-op evaluation,  At surgeon's request and post-op pain management  Laterality: Right  Prep: chloraprep       Needles:  Injection technique: Single-shot  Needle Type: Echogenic Stimulator Needle     Needle Length: 5cm 5 cm Needle Gauge: 22 and 22 G    Additional Needles:  Procedures: ultrasound guided and nerve stimulator Interscalene brachial plexus block  Nerve Stimulator or Paresthesia:  Response: biceps flexion, 0.45 mA,   Additional Responses:   Narrative:  Start time: 12/25/2011 7:01 AM End time: 12/25/2011 7:13 AM Injection made incrementally with aspirations every 5 mL.  Performed by: Personally  Anesthesiologist: Dr Chaney Malling  Additional Notes: Functioning IV was confirmed and monitors were applied.  A 50mm 22ga Arrow echogenic stimulator needle was used. Sterile prep and drape,hand hygiene and sterile gloves were used.  Negative aspiration and negative test dose prior to incremental administration of local anesthetic. The patient tolerated the procedure well.  Ultrasound guidance: relevent anatomy identified, needle position confirmed, local anesthetic spread visualized around nerve(s), vascular puncture avoided.  Image printed for medical record.   Interscalene brachial plexus block Procedure Name: Intubation Date/Time: 12/25/2011 7:45 AM Performed by: Garen Lah Pre-anesthesia Checklist: Patient identified, Timeout performed, Emergency Drugs available, Suction available and Patient being monitored Patient Re-evaluated:Patient Re-evaluated prior to inductionOxygen Delivery Method: Circle system utilized Preoxygenation: Pre-oxygenation with 100% oxygen Intubation Type: IV induction Ventilation: Mask  ventilation without difficulty Laryngoscope Size: Mac and 3 Grade View: Grade I Tube type: Oral Tube size: 7.5 mm Number of attempts: 1 Airway Equipment and Method: Stylet Placement Confirmation: ETT inserted through vocal cords under direct vision,  positive ETCO2 and breath sounds checked- equal and bilateral Secured at: 21 cm Tube secured with: Tape Dental Injury: Teeth and Oropharynx as per pre-operative assessment

## 2011-12-25 NOTE — Anesthesia Postprocedure Evaluation (Signed)
Anesthesia Post Note  Patient: Levi Turner  Procedure(s) Performed: Procedure(s) (LRB): REVERSE SHOULDER ARTHROPLASTY (Right)  Anesthesia type: General  Patient location: PACU  Post pain: Pain level controlled and Adequate analgesia  Post assessment: Post-op Vital signs reviewed, Patient's Cardiovascular Status Stable, Respiratory Function Stable, Patent Airway and Pain level controlled  Last Vitals:  Filed Vitals:   12/25/11 1115  BP:   Pulse: 78  Temp:   Resp: 14    Post vital signs: Reviewed and stable  Level of consciousness: awake, alert  and oriented  Complications: No apparent anesthesia complications

## 2011-12-25 NOTE — Op Note (Signed)
12/25/2011  10:02 AM  PATIENT:   Levi Turner  61 y.o. male  PRE-OPERATIVE DIAGNOSIS:  right shoulder rotator cuff arthropathy   POST-OPERATIVE DIAGNOSIS:  same  PROCEDURE:  R reverse shoulder arthroplasty #14 cemented stem, +3 poly, 42 eccentric glenosphere  SURGEON:  Pollyanna Levay, Vania Rea M.D.  ASSISTANTS: Shuford pac   ANESTHESIA:   GET + ISB  EBL: 200cc  SPECIMEN:  none  Drains: none   PATIENT DISPOSITION:  PACU - hemodynamically stable.    PLAN OF CARE: Admit to inpatient   Dictation# [id number]

## 2011-12-25 NOTE — Anesthesia Preprocedure Evaluation (Addendum)
Anesthesia Evaluation  Patient identified by MRN, date of birth, ID band Patient awake    Reviewed: Allergy & Precautions, H&P , NPO status , Patient's Chart, lab work & pertinent test results, reviewed documented beta blocker date and time   History of Anesthesia Complications (+) PROLONGED EMERGENCE  Airway Mallampati: I TM Distance: >3 FB     Dental  (+) Dental Advisory Given and Teeth Intact   Pulmonary neg sleep apnea,  breath sounds clear to auscultation        Cardiovascular hypertension, Pt. on medications Rhythm:Regular Rate:Normal     Neuro/Psych PSYCHIATRIC DISORDERS Anxiety Depression    GI/Hepatic PUD,   Endo/Other    Renal/GU      Musculoskeletal  (+) Arthritis -,   Abdominal (+)  Abdomen: soft. Bowel sounds: normal.  Peds  Hematology   Anesthesia Other Findings   Reproductive/Obstetrics                        Anesthesia Physical Anesthesia Plan  ASA: II  Anesthesia Plan: General   Post-op Pain Management: MAC Combined w/ Regional for Post-op pain   Induction: Intravenous  Airway Management Planned: Oral ETT  Additional Equipment:   Intra-op Plan:   Post-operative Plan: Extubation in OR  Informed Consent: I have reviewed the patients History and Physical, chart, labs and discussed the procedure including the risks, benefits and alternatives for the proposed anesthesia with the patient or authorized representative who has indicated his/her understanding and acceptance.     Plan Discussed with: CRNA and Surgeon  Anesthesia Plan Comments:        Anesthesia Quick Evaluation

## 2011-12-25 NOTE — Preoperative (Signed)
Beta Blockers   Reason not to administer Beta Blockers:Not Applicable 

## 2011-12-25 NOTE — Transfer of Care (Signed)
Immediate Anesthesia Transfer of Care Note  Patient: Levi Turner  Procedure(s) Performed: Procedure(s) (LRB) with comments: REVERSE SHOULDER ARTHROPLASTY (Right) - Right reverse shoulder arthroplasty  Patient Location: PACU  Anesthesia Type: GA combined with regional for post-op pain  Level of Consciousness: sedated  Airway & Oxygen Therapy: Patient Spontanous Breathing and Patient connected to nasal cannula oxygen  Post-op Assessment: Report given to PACU RN and Post -op Vital signs reviewed and stable  Post vital signs: Reviewed  Complications: No apparent anesthesia complications

## 2011-12-25 NOTE — H&P (Signed)
Levi Turner    Chief Complaint: right shoulder rotator cuff arthropathy  HPI: The patient is a 61 y.o. male with end stage right shoulder rotator cuff tear arthropathy  Past Medical History  Diagnosis Date  . Complication of anesthesia     "HARD TO WAKE UP"  . Shoulder pain     RIGHT  . Difficulty sleeping     takes Melatonin nightly and pain pill  . Ulcerative colitis     recieves IV Remicade for treatment;Mercaptopurine and Lialda daily  . Hypertension     takes Lisinopril and Amlodipine daily  . Hyperlipidemia     takes Simvastatin nightly  . Joint pain   . Joint swelling   . Arthritis     shoulders and knees  . Anemia     takes Ferrous Sulfate daily  . Glaucoma   . Depression     just b/c out of work d/t pain in shoulder    Past Surgical History  Procedure Date  . Shoulder surgery     ROTATOR CUFF REPAIR-left  . Knee arthroscopy     RT KNEE  . Foot surgery     LEFT  . Total knee arthroplasty 09/17/2011-right    Procedure: TOTAL KNEE ARTHROPLASTY;  Surgeon: Jacki Cones, MD;  Location: [facility];  Service: Orthopedics;  Laterality: Right;  . Cardiac catheterization 2011  . Inguinal hernia repair     at age 34  . Colonoscopy   . Esophagogastroduodenoscopy     History reviewed. No pertinent family history.  Social History:  reports that he has quit smoking. He quit smokeless tobacco use about 20 years ago. He reports that he does not drink alcohol or use illicit drugs.  Allergies: No Known Allergies  Medications Prior to Admission  Medication Sig Dispense Refill  . acetaminophen (TYLENOL) 500 MG tablet Take 1,500 mg by mouth every morning.      Marland Kitchen acidophilus (RISAQUAD) CAPS Take 1 capsule by mouth every morning.      Marland Kitchen amLODipine (NORVASC) 5 MG tablet Take 5 mg by mouth at bedtime.      . B Complex-C (B-COMPLEX WITH VITAMIN C) tablet Take 1 tablet by mouth every morning.      Marland Kitchen Bioflavonoid Products (ESTER C PO) Take 1 tablet by mouth every morning.       . calcium-vitamin D (OSCAL 500/200 D-3) 500-200 MG-UNIT per tablet Take 1 tablet by mouth every morning.      . cholecalciferol (VITAMIN D) 1000 UNITS tablet Take 2,000 Units by mouth daily.      . ferrous sulfate 325 (65 FE) MG EC tablet Take 1,300 mg by mouth daily with breakfast.      . Glucosamine HCl 1000 MG TABS Take 1,000 mg by mouth every morning.      Marland Kitchen HYDROcodone-acetaminophen (NORCO/VICODIN) 5-325 MG per tablet Take 2 tablets by mouth at bedtime.      Marland Kitchen lisinopril (PRINIVIL,ZESTRIL) 20 MG tablet Take 40 mg by mouth at bedtime.      Marland Kitchen MAGNESIUM CARBONATE PO Take 500 mg by mouth 2 (two) times daily.      . Melatonin 10 MG TABS Take 10 mg by mouth at bedtime.      . mercaptopurine (PURINETHOL) 50 MG tablet Take 50 mg by mouth daily before breakfast. Give on an empty stomach 1 hour before or 2 hours after meals. Caution: Chemotherapy.      . mesalamine (LIALDA) 1.2 G EC tablet Take 4,800 mg by  mouth daily with breakfast.      . Multiple Vitamin (MULTIVITAMIN WITH MINERALS) TABS Take 2 tablets by mouth every morning.      . potassium gluconate 595 MG TABS Take 595 mg by mouth every morning.      . Saw Palmetto, Serenoa repens, (SAW PALMETTO PO) Take 1 tablet by mouth every morning.      . simvastatin (ZOCOR) 80 MG tablet Take 80 mg by mouth at bedtime.      . vitamin E 400 UNIT capsule Take 400 Units by mouth every morning.      Marland Kitchen PRESCRIPTION MEDICATION Inject into the vein every 8 (eight) weeks. Injection from [facility] short stay center         Physical Exam: right shoulder with painful and restricted right shoudler motion as noted at recent office visit  Vitals  Temp:  [98 F (36.7 C)] 98 F (36.7 C) (09/19 0558) Pulse Rate:  [72] 72  (09/19 0558) Resp:  [18] 18  (09/19 0558) BP: (161)/(78) 161/78 mmHg (09/19 0558) SpO2:  [100 %] 100 % (09/19 0558)  Assessment/Plan  Impression: right shoulder rotator cuff arthropathy   Plan of Action: Procedure(s): REVERSE SHOULDER  ARTHROPLASTY  Levi Turner M 12/25/2011, 7:32 AM

## 2011-12-26 ENCOUNTER — Encounter (HOSPITAL_COMMUNITY): Payer: Self-pay | Admitting: Orthopedic Surgery

## 2011-12-26 DIAGNOSIS — M12811 Other specific arthropathies, not elsewhere classified, right shoulder: Secondary | ICD-10-CM

## 2011-12-26 MED ORDER — DIAZEPAM 5 MG PO TABS: 5.0000 mg | ORAL_TABLET | Freq: Four times a day (QID) | ORAL | Status: DC | PRN

## 2011-12-26 MED ORDER — HYDROMORPHONE HCL 2 MG PO TABS: 2.0000 mg | ORAL_TABLET | ORAL | Status: DC | PRN

## 2011-12-26 NOTE — Progress Notes (Signed)
I agree with the following treatment note after reviewing documentation.   Johnston, Emidio Warrell Brynn   OTR/L Pager: [phone] Office: [phone] .   

## 2011-12-26 NOTE — Op Note (Signed)
NAMEAINSLEY, BEAVERSON NO.:  [id number]  MEDICAL RECORD NO.:  [id number]  LOCATION:  5N04C                        FACILITY:  [facility]  PHYSICIAN:  Vania Rea. Natale Barba, M.D.  DATE OF BIRTH:  1950-09-13  DATE OF PROCEDURE: DATE OF DISCHARGE:                              OPERATIVE REPORT   ADDENDUM:  Ralene Bathe, PA-C was used in assistance throughout this case, essential for help with positioning of extremity, tissue retraction, tissue manipulation, implantation of the components, wound closure, and intraoperative decision making.     Vania Rea. Tanna Loeffler, M.D.     KMS/MEDQ  D:  12/25/2011  T:  12/26/2011  Job:  [id number]

## 2011-12-26 NOTE — Progress Notes (Signed)
Occupational Therapy Discharge Patient Details Name: Levi Turner MRN: [id number] DOB: 1950/05/16 Today's Date: 12/26/2011 Time: 1478-2956 OT Time Calculation (min): 45 min  Patient discharged from OT services secondary to All education completed with pt and wife. Both demonstrated understanding.  Please see latest therapy progress note for current level of functioning and progress toward goals.    Progress and discharge plan discussed with patient and/or caregiver: Patient/Caregiver agrees with plan  GO     Cleora Fleet 12/26/2011, 10:17 AM

## 2011-12-26 NOTE — Progress Notes (Signed)
Occupational Therapy Evaluation Patient Details Name: Levi Turner MRN: [id number] DOB: [birth year] Today's Date: 12/26/2011 Time: 4098-1191 OT Time Calculation (min): 45 min  OT Assessment / Plan / Recommendation Clinical Impression  Pt. 61 yo male s/p right reverse TSA. All education completed with pt and wife. Does not requie OT acutely.    OT Assessment  Progress rehab of shoulder as ordered by MD at follow-up appointment    Follow Up Recommendations  No OT follow up    Barriers to Discharge      Equipment Recommendations       Recommendations for Other Services    Frequency       Precautions / Restrictions Precautions Precautions: Shoulder Type of Shoulder Precautions: Supple shoulder protcol: 90 shoulder flexion, 60 abduction, 30 external rotation   Precaution Booklet Issued: Yes (comment) Restrictions Weight Bearing Restrictions: Yes RUE Weight Bearing: Non weight bearing   Pertinent Vitals/Pain No pain reported    ADL  Grooming: Wash/dry face;Minimal assistance Where Assessed - Grooming: Unsupported sitting Upper Body Bathing: Performed;Maximal assistance;Right arm (Wife educated to (A) at home) Where Assessed - Upper Body Bathing: Unsupported sitting Upper Body Dressing: Performed;Maximal assistance (educated to dress rt arm first ) Where Assessed - Upper Body Dressing: Unsupported sitting Lower Body Dressing: Performed;Minimal assistance Where Assessed - Lower Body Dressing: Unsupported sit to stand Equipment Used: Other (comment) (blue sling) Transfers/Ambulation Related to ADLs: Pt. up and ambulating in room upon arrival. Independent for transfers ADL Comments: Pt with all education completed and shoulder handouts provided. Pt. will have wife to (A) with RUE dressing and bathing.    OT Diagnosis:    OT Problem List:   OT Treatment Interventions:     OT Goals    Visit Information  Last OT Received On: 12/26/11 Assistance Needed: +1      Subjective Data  Subjective: There is nothing like going back to your own home Patient Stated Goal: to have a smooth recovery    Prior Functioning  Vision/Perception  Home Living Lives With: Spouse Available Help at Discharge: Family Type of Home: House Home Access: Stairs to enter Secretary/administrator of Steps: 3 Entrance Stairs-Rails: None Home Layout: Two level Alternate Level Stairs-Number of Steps: 13 Alternate Level Stairs-Rails: Right Bathroom Shower/Tub: Tub/shower unit;Curtain Bathroom Toilet: Handicapped height Home Adaptive Equipment: Crutches;Straight cane;Shower chair with back Prior Function Level of Independence: Independent Able to Take Stairs?: Yes Driving: Yes Vocation: Full time employment (textile, 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 ) Communication Communication: No difficulties Dominant Hand: Right      Cognition  Overall Cognitive Status: Appears within functional limits for tasks assessed/performed Arousal/Alertness: Awake/alert Orientation Level: Oriented X4 / Intact Behavior During Session: [facility] for tasks performed    Extremity/Trunk Assessment Trunk Assessment Trunk Assessment: Normal   Mobility  Shoulder Instructions  Bed Mobility Bed Mobility: Sit to Supine Sit to Supine: 7: Independent Transfers Transfers: Sit to Stand;Stand to Sit Sit to Stand: 7: Independent;From bed;From chair/3-in-1 Stand to Sit: 7: Independent;To bed   Donning/doffing shirt without moving shoulder: Caregiver independent with task;Minimal assistance Method for sponge bathing under operated UE: Caregiver independent with task;Minimal assistance Donning/doffing sling/immobilizer: Caregiver independent with task;Minimal assistance Correct positioning of sling/immobilizer: Caregiver independent with task;Minimal assistance Pendulum exercises (written home exercise program):  (n/a) ROM for elbow, wrist and digits of operated UE: Modified independent Sling  wearing schedule (on at all times/off for ADL's): Modified independent Proper positioning of operated UE when showering: Modified independent Positioning of UE while sleeping:  Modified independent   Exercise Shoulder Exercises Shoulder Flexion: AAROM;Right;10 reps;Supine;Other (comment) (90 degrees) Shoulder ABduction: AAROM;Right;10 reps;Supine;Other (comment) (60 degrees) Shoulder External Rotation: AAROM;10 reps;Supine (30 degrees) Elbow Flexion: AROM;10 reps;Seated Elbow Extension: AROM;10 reps;Seated Wrist Flexion: AROM;10 reps;Seated Wrist Extension: AROM;10 reps;Seated Digit Composite Flexion: AROM;10 reps;Seated Composite Extension: AROM;10 reps;Seated        End of Session OT - End of Session Equipment Utilized During Treatment:  (blue sling) Activity Tolerance: Patient tolerated treatment well Patient left: in bed;with call bell/phone within reach;with family/visitor present Nurse Communication: Precautions  GO     Cleora Fleet 12/26/2011, 10:16 AM

## 2011-12-26 NOTE — Progress Notes (Signed)
I agree with the following treatment note after reviewing documentation.   Johnston, Mohd Clemons Brynn   OTR/L Pager: [phone] Office: [phone] .   

## 2011-12-26 NOTE — Op Note (Signed)
NAMEHJALMER, DANOWSKI NO.:  [id number]  MEDICAL RECORD NO.:  [id number]  LOCATION:  5N04C                        FACILITY:  [facility]  PHYSICIAN:  Vania Rea. Abagail Limb, M.D.  DATE OF BIRTH:  1950-05-24  DATE OF PROCEDURE:  12/25/2011 DATE OF DISCHARGE:                              OPERATIVE REPORT   PREOPERATIVE DIAGNOSIS:  Right shoulder end-stage rotator cuff tear arthropathy.  POSTOPERATIVE DIAGNOSIS:  Right shoulder end-stage rotator cuff tear arthropathy.  PROCEDURE:  Right reverse shoulder arthroplasty utilizing a cemented size 14 DePuy stem with a +3 poly and a 42 eccentric glenosphere.  SURGEON:  Vania Rea. Alis Sawchuk, M.D.  Threasa HeadsFrench Ana A. Shuford, PA-C  ANESTHESIA:  General endotracheal as well as an interscalene block.  ESTIMATED BLOOD LOSS:  200 mL.  DRAINS:  None.  HISTORY:  Mr. Birkeland is a 61 year old gentleman who has had chronic and progressive increasing right shoulder pain with functional limitations and limited motion secondary to right shoulder end-stage rotator cuff tear arthropathy.  He is brought to the operating room at this time for planned right reverse shoulder arthroplasty. Preoperatively, I counseled Mr. Hagin on treatment options as well as risks versus benefits thereof.  Possible surgical complications were reviewed including potential for bleeding, infection, neurovascular injury, persistent pain, loss of motion, failure of the implant, anesthetic complication, and possible need for additional surgery.  He understands and accepts and agrees with our planned procedure.  PROCEDURE IN DETAIL:  After undergoing routine preoperative evaluation, the patient received prophylactic antibiotics.  An interscalene block was established in the holding area by the Anesthesia Department. Placed supine on operating table, underwent smooth induction of a general endotracheal anesthesia.  Placed into beach-chair position  and appropriately padded and protected.  The right shoulder girdle region was then sterilely prepped and draped in standard fashion.  Time-out was called.  In an anterior approach, the right shoulder was made through the deltopectoral interval and incision approximately 15 cm in length beginning at the coracoid process extending laterally and distally. Skin flaps were elevated.  Electrocautery was used for hemostasis.  The cephalic vein was identified and was mobilized and retracted laterally with the deltoid.  Interval was then developed proximal to distal.  Care was taken to protect the cephalic vein.  Tenotomized the upper centimeter over the pectoralis major and then identified the conjoined tendon, was mobilized and retracted medially, and self-retaining retractors were placed.  Then, electrocoagulated the anterior humeral circumflex vessels.  The bicipital groove was then unroofed and the biceps tendon was then tenotomized for later tenodesis.  The subscapularis was then divided away from the lesser tuberosity utilizing electrocautery and free margin was then tagged with #2 FiberWire.  It was then retracted medially.  The humeral head was then delivered through the wound and there was an obvious chronic defect of the entire supra and infraspinatus.  These were reflected posteriorly allowing exposure of the humeral head.  A rongeur was then used to resect the articular cartilage at the apex of the head and then access to the humeral medullary canal was then gained with a canal finder and then sequential reamings were performed by hand up to size 14.  The size  14 cutting guide was then placed and off the outrigger.  We made a resection of the humeral head at 0 degrees of retroversion.  We then placed a metal cap over the cut surface of the proximal humerus and then exposed the glenoid with combination of Fukuda, pitchfork, and snake tongue retractors.  We performed a circumferential  labral resection including the proximal root of the biceps and also inferiorly divided the medial head of the triceps to allow complete exposure of the inferior glenoid.  Once the glenoid was circumferentially exposed, we placed a guidepin at the center of the glenoid, reamed to subchondral bone and used the peripheral reamer to remain removal residual bone at the margins and then placed a central drill hole, and then impacted the glenoid base plate.  It was then transfixed utilizing locking screws inferior, superior and anterior, placed using standard technique and then a nonlocking screw posteriorly, but all of these obtained excellent bony purchase.  The locking screws were then terminally locked using the locking mechanism.  We then placed a guidewire into the glenoid base plate and the 42 eccentric glenosphere was then passed along the guidewire and then introduced across the glenoid base plate with the eccentric portion directed inferiorly and then it was terminally tightened and impacted with positioning all to our satisfaction.  We then returned our attention to the cut surface of the proximal humerus and I should mention that to appropriately seat the glenosphere, we had to resect some additional bone from the proximal humerus to gain appropriate exposure.  Again, once the glenosphere was in place, we then returned our direction to the proximal humerus where we placed the size 14 stem and used the size 1 reamer to ream the proximal humerus and had to actually seat the implant slightly deeper to gain appropriate soft tissue balance.  Once this had been completed, we placed the trial size 14 stem and performed trial reductions and the +3 poly showed excellent soft tissue balance and good stability.  The implant was then dislocated.  The trial poly was removed and we then placed a distal cement plug into the humeral canal.  The canal was then irrigated and dried.  Cement was mixed  and at the appropriate consistency, cement was introduced into the humeral canal in retrograde fashion, and at the appropriate consistency, the humeral stem with size 14 was introduced into the humeral canal, impacted into position with 0 degrees retroversion and extra cement was meticulously removed.  We then once again performed trial reductions after the cement had hardened, and again found excellent soft tissue balance with the +3 poly.  We removed the trial poly and impacted the final +3 poly after the stem was meticulously cleaned and dried.  Final reduction was performed, which showed once again excellent soft tissue balance with good motion of the shoulder and excellent stability.  All this was much to our satisfaction.  I then performed a biceps tenodesis utilizing #2 FiberWire and also repaired the subscapularis back to the proximal humerus using the #2 FiberWires.  The wound was then copiously irrigated.  The deltopectoral interval was reapproximated with #1 Vicryl.  2-0 Vicryl was used for subcu layer and intracuticular 3-0 Monocryl for the skin followed by Steri-Strips.  Dry dressing was placed over the right shoulder and right arm was placed in a sling.  The patient was then awakened, extubated, and taken to the recovery room in stable condition.     Vania Rea. Ayah Cozzolino, M.D.  KMS/MEDQ  D:  12/25/2011  T:  12/26/2011  Job:  [id number]

## 2011-12-26 NOTE — Progress Notes (Addendum)
INITIAL ADULT NUTRITION ASSESSMENT Date: 12/26/2011   Time: 10:25 AM Reason for Assessment: MST  ASSESSMENT: Male 61 y.o.  Dx: Rotator cuff tear arthropathy of right shoulder  Hx:  Past Medical History  Diagnosis Date  . Shoulder pain     RIGHT  . Difficulty sleeping     takes Melatonin nightly and pain pill  . Ulcerative colitis     recieves IV Remicade for treatment;Mercaptopurine and Lialda daily  . Hypertension     takes Lisinopril and Amlodipine daily  . Hyperlipidemia     takes Simvastatin nightly  . Joint pain   . Joint swelling   . Anemia     takes Ferrous Sulfate daily  . Glaucoma   . Depression     just b/c out of work d/t pain in shoulder  . Complication of anesthesia ~ 2000    "HARD TO WAKE UP"  . Arthritis     shoulders and knees   Past Surgical History  Procedure Date  . Knee arthroscopy ~ 2009    right  . Foot surgery 1990's    LEFT; "growth sprouted up"  . Total knee arthroplasty 09/17/2011-right    Procedure: TOTAL KNEE ARTHROPLASTY;  Surgeon: Jacki Cones, MD;  Location: [facility];  Service: Orthopedics;  Laterality: Right;  . Cardiac catheterization 2011  . Colonoscopy   . Esophagogastroduodenoscopy   . Reverse total shoulder arthroplasty 12/25/2011    right  . Shoulder open rotator cuff repair ~ 2010    left  . Inguinal hernia repair ~ 1962    bilateral    Related Meds:  Scheduled Meds:   . amLODipine  5 mg Oral QHS  . atorvastatin  40 mg Oral q1800  .  ceFAZolin (ANCEF) IV  1 g Intravenous Q6H  . docusate sodium  100 mg Oral BID  . HYDROmorphone      . influenza  inactive virus vaccine  0.5 mL Intramuscular Tomorrow-1000  . ketorolac  15 mg Intravenous Q6H  . lisinopril  40 mg Oral QHS  . mercaptopurine  50 mg Oral QAC breakfast  . mesalamine  4,800 mg Oral Q breakfast   Continuous Infusions:   . lactated ringers 75 mL/hr at 12/26/11 0143  . DISCONTD: lactated ringers     PRN Meds:.acetaminophen, acetaminophen,  diphenhydrAMINE, HYDROmorphone (DILAUDID) injection, menthol-cetylpyridinium, methocarbamol (ROBAXIN) IV, methocarbamol, metoCLOPramide (REGLAN) injection, metoCLOPramide, ondansetron (ZOFRAN) IV, ondansetron, oxyCODONE-acetaminophen, phenol, temazepam, DISCONTD:  HYDROmorphone (DILAUDID) injection, DISCONTD: ondansetron (ZOFRAN) IV  Ht:  6'  Wt:  159 lbs (obtained 9/13)  Ideal Wt:    178 lbs % Ideal Wt: 89%  Usual Wt: 178 lbs per pt % Usual Wt: 89%  BMI: 21.6, WNL  Food/Nutrition Related Hx: 10.6% wt loss in 1 yr, decreased appetite PTA  Labs:  CMP     Component Value Date/Time   NA 137 12/19/2011 0936   K 4.2 12/19/2011 0936   CL 101 12/19/2011 0936   CO2 26 12/19/2011 0936   GLUCOSE 108* 12/19/2011 0936   BUN 11 12/19/2011 0936   CREATININE 0.77 12/19/2011 0936   CALCIUM 10.1 12/19/2011 0936   PROT 7.6 09/10/2011 1215   ALBUMIN 4.6 09/10/2011 1215   AST 14 09/10/2011 1215   ALT 20 09/10/2011 1215   ALKPHOS 54 09/10/2011 1215   BILITOT 0.4 09/10/2011 1215   GFRNONAA >90 12/19/2011 0936   GFRAA >90 12/19/2011 0936    CBC    Component Value Date/Time   WBC 3.7* 12/19/2011 1610  RBC 4.67 12/19/2011 0936   HGB 13.2 12/19/2011 0936   HCT 39.9 12/19/2011 0936   PLT 279 12/19/2011 0936   MCV 85.4 12/19/2011 0936   MCH 28.3 12/19/2011 0936   MCHC 33.1 12/19/2011 0936   RDW 15.6* 12/19/2011 0936   LYMPHSABS 1.3 09/10/2011 1215   MONOABS 0.5 09/10/2011 1215   EOSABS 0.1 09/10/2011 1215   BASOSABS 0.0 09/10/2011 1215    Intake: 50% per pt Output:   Intake/Output Summary (Last 24 hours) at 12/26/11 1028 Last data filed at 12/25/11 1100  Gross per 24 hour  Intake    250 ml  Output      0 ml  Net    250 ml   Last BM (9/19)  Diet Order: General  Supplements/Tube Feeding: none at this time  IVF:    lactated ringers Last Rate: 75 mL/hr at 12/26/11 0143  DISCONTD: lactated ringers     Estimated Nutritional Needs:   Kcal: 2160-2520 Protein: 94-108g Fluid: >2.2 L/day  Pt reports a change  in lifestyle that occurred when he hurt his shoulder earlier this year.  This has lead to decreased activity, increased stress, and appetite loss which has resulted in a 10.6% wt loss in <1 year, 9.1% occuring in the last 4 months.  Discussed wt loss with pt, and the importance of nutrition for healing.  Pt states that he has not changed his eating habits- eating the same amount and type of food as he normally does.  Dicussed the affects of healing and stress on metabolism and his likely increased needs.  Discussed high calorie, high protein foods as well as meal spacing/planning with pt and wife. Encouraged monitoring of wt and continued discussion with healthcare providers if wt loss persists.  Pt qualifies for non-severe malnutrition of chronic illness due to pt meeting <75% of estimated needs based on dietary recall provided by wife, and pt with mild depletion of subcuntaneous fat observed in at temples and lean body mass.  Pt has also lost >9% of wt loss in 5 months which without correction will likely lead to >/=10% wt loss in 6 months which is clinically significant.   NUTRITION DIAGNOSIS: Unintended wt loss  RELATED TO: decreased appetite, stress  AS EVIDENCE BY: pt with 10.6% wt loss in <1 yr  MONITORING/EVALUATION(Goals): 1.  Food/Beverage; pt consuming >75% of 3 meals daily 2.  Wt/wt change; deter loss  EDUCATION NEEDS: -Education needs addressed with pt and wife re: wt management  INTERVENTION: 1.  General healthful diet; wt management and nutrition status discussed with pt and wife.  Suggestions and education provided.  Pt and wife verbalize understanding of information presented and agree pt's wt loss needs to be monitored closely.    Dietitian [phone]  DOCUMENTATION CODES Per approved criteria  -Non-severe malnutrition of chronic illness    Loyce Dys [facility] 12/26/2011, 10:25 AM

## 2011-12-26 NOTE — Discharge Summary (Signed)
  PATIENT ID:      Levi Turner  MRN:     [id number] DOB/AGE:    1950/11/22 / 61 y.o.     DISCHARGE SUMMARY  ADMISSION DATE:    12/25/2011 DISCHARGE DATE:   12/26/2011   ADMISSION DIAGNOSIS: right shoulder rotator cuff arthropathy   (right shoulder rotator cuff arthropathy )  DISCHARGE DIAGNOSIS:  right shoulder rotator cuff arthropathy     ADDITIONAL DIAGNOSIS: Principal Problem:  *Rotator cuff tear arthropathy of right shoulder  Past Medical History  Diagnosis Date  . Shoulder pain     RIGHT  . Difficulty sleeping     takes Melatonin nightly and pain pill  . Ulcerative colitis     recieves IV Remicade for treatment;Mercaptopurine and Lialda daily  . Hypertension     takes Lisinopril and Amlodipine daily  . Hyperlipidemia     takes Simvastatin nightly  . Joint pain   . Joint swelling   . Anemia     takes Ferrous Sulfate daily  . Glaucoma   . Depression     just b/c out of work d/t pain in shoulder  . Complication of anesthesia ~ 2000    "HARD TO WAKE UP"  . Arthritis     shoulders and knees    PROCEDURE: Procedure(s): REVERSE SHOULDER ARTHROPLASTY on 12/25/2011  CONSULTS:     HISTORY:  See H&P in chart  HOSPITAL COURSE:  Levi Turner is a 61 y.o. admitted on 12/25/2011 and found to have a diagnosis of right shoulder rotator cuff arthropathy .  After appropriate laboratory studies were obtained  they were taken to the operating room on 12/25/2011 and underwent Procedure(s): REVERSE SHOULDER ARTHROPLASTY.   They were given perioperative antibiotics:  Anti-infectives     Start     Dose/Rate Route Frequency Ordered Stop   12/25/11 1300   ceFAZolin (ANCEF) IVPB 1 g/50 mL premix        1 g 100 mL/hr over 30 Minutes Intravenous Every 6 hours 12/25/11 1212 12/26/11 0215   12/25/11 0500   ceFAZolin (ANCEF) IVPB 2 g/50 mL premix        2 g 100 mL/hr over 30 Minutes Intravenous 60 min pre-op 12/24/11 1409 12/25/11 0742        . Blood products  given:none  Pt was comfortable the AM after surgery and was NVI. His incision had really no drainage or active bleeding. His VS were stable The remainder of the hospital course was dedicated to ambulation and strengthening.   The patient was discharged on 1 Day Post-Op in  Good condition.

## 2012-01-12 ENCOUNTER — Other Ambulatory Visit (HOSPITAL_COMMUNITY): Payer: Self-pay | Admitting: *Deleted

## 2012-01-13 ENCOUNTER — Encounter (HOSPITAL_COMMUNITY)
Admission: RE | Admit: 2012-01-13 | Discharge: 2012-01-13 | Disposition: A | Discharge status: Home / Self Care | Payer: BC Managed Care – PPO | Source: Ambulatory Visit | Attending: Gastroenterology | Admitting: Gastroenterology

## 2012-01-13 DIAGNOSIS — K5150 Left sided colitis without complications: Secondary | ICD-10-CM | POA: Insufficient documentation

## 2012-01-13 MED ORDER — ACETAMINOPHEN 325 MG PO TABS: 650.0000 mg | ORAL_TABLET | ORAL | Status: DC

## 2012-01-13 MED ORDER — SODIUM CHLORIDE 0.9 % IV SOLN
5.0000 mg/kg | INTRAVENOUS | Status: DC
  Administered 2012-01-13: 400 mg via INTRAVENOUS
  Filled 2012-01-13: qty 40

## 2012-01-13 MED ORDER — DIPHENHYDRAMINE HCL 25 MG PO TABS
25.0000 mg | ORAL_TABLET | ORAL | Status: DC
  Filled 2012-01-13: qty 1

## 2012-01-13 MED ORDER — SODIUM CHLORIDE 0.9 % IV SOLN
INTRAVENOUS | Status: DC
  Administered 2012-01-13: 250 mL via INTRAVENOUS

## 2012-03-08 ENCOUNTER — Other Ambulatory Visit (HOSPITAL_COMMUNITY): Payer: Self-pay | Admitting: *Deleted

## 2012-03-09 ENCOUNTER — Encounter (HOSPITAL_COMMUNITY)
Admission: RE | Admit: 2012-03-09 | Discharge: 2012-03-09 | Disposition: A | Discharge status: Home / Self Care | Payer: 59 | Source: Ambulatory Visit | Attending: Gastroenterology | Admitting: Gastroenterology

## 2012-03-09 DIAGNOSIS — K5150 Left sided colitis without complications: Secondary | ICD-10-CM | POA: Insufficient documentation

## 2012-03-09 MED ORDER — SODIUM CHLORIDE 0.9 % IV SOLN
5.0000 mg/kg | INTRAVENOUS | Status: DC
  Administered 2012-03-09: 400 mg via INTRAVENOUS
  Filled 2012-03-09: qty 40

## 2012-03-09 MED ORDER — DIPHENHYDRAMINE HCL 25 MG PO TABS
25.0000 mg | ORAL_TABLET | ORAL | Status: DC
  Filled 2012-03-09: qty 1

## 2012-03-09 MED ORDER — ACETAMINOPHEN 325 MG PO TABS: 650.0000 mg | ORAL_TABLET | ORAL | Status: DC

## 2012-03-09 MED ORDER — SODIUM CHLORIDE 0.9 % IV SOLN
INTRAVENOUS | Status: AC
  Administered 2012-03-09: 10:00:00 via INTRAVENOUS

## 2012-05-04 ENCOUNTER — Encounter (HOSPITAL_COMMUNITY)
Admission: RE | Admit: 2012-05-04 | Discharge: 2012-05-04 | Disposition: A | Discharge status: Home / Self Care | Payer: 59 | Source: Ambulatory Visit | Attending: Gastroenterology | Admitting: Gastroenterology

## 2012-05-04 DIAGNOSIS — K5150 Left sided colitis without complications: Secondary | ICD-10-CM | POA: Insufficient documentation

## 2012-05-04 MED ORDER — SODIUM CHLORIDE 0.9 % IV SOLN
5.0000 mg/kg | INTRAVENOUS | Status: AC
  Administered 2012-05-04 (×2): 400 mg via INTRAVENOUS
  Filled 2012-05-04: qty 40

## 2012-05-04 MED ORDER — SODIUM CHLORIDE 0.9 % IV SOLN
INTRAVENOUS | Status: AC
  Administered 2012-05-04: 11:00:00 via INTRAVENOUS

## 2012-05-04 MED ORDER — ACETAMINOPHEN 325 MG PO TABS: 650.0000 mg | ORAL_TABLET | ORAL | Status: DC

## 2012-05-04 MED ORDER — DIPHENHYDRAMINE HCL 25 MG PO TABS: 25.0000 mg | ORAL_TABLET | ORAL | Status: DC

## 2012-06-29 ENCOUNTER — Encounter (HOSPITAL_COMMUNITY)
Admission: RE | Admit: 2012-06-29 | Discharge: 2012-06-29 | Disposition: A | Discharge status: Home / Self Care | Payer: 59 | Source: Ambulatory Visit | Attending: Gastroenterology | Admitting: Gastroenterology

## 2012-06-29 DIAGNOSIS — K5150 Left sided colitis without complications: Secondary | ICD-10-CM | POA: Insufficient documentation

## 2012-06-29 MED ORDER — ACETAMINOPHEN 325 MG PO TABS: 650.0000 mg | ORAL_TABLET | ORAL | Status: DC

## 2012-06-29 MED ORDER — SODIUM CHLORIDE 0.9 % IV SOLN
INTRAVENOUS | Status: DC
  Administered 2012-06-29: 10:00:00 via INTRAVENOUS

## 2012-06-29 MED ORDER — SODIUM CHLORIDE 0.9 % IV SOLN
5.0000 mg/kg | INTRAVENOUS | Status: DC
  Administered 2012-06-29: 400 mg via INTRAVENOUS
  Filled 2012-06-29: qty 40

## 2012-06-29 MED ORDER — DIPHENHYDRAMINE HCL 25 MG PO TABS: 25.0000 mg | ORAL_TABLET | ORAL | Status: DC

## 2012-08-23 ENCOUNTER — Other Ambulatory Visit (HOSPITAL_COMMUNITY): Payer: Self-pay | Admitting: *Deleted

## 2012-08-24 ENCOUNTER — Encounter (HOSPITAL_COMMUNITY)
Admission: RE | Admit: 2012-08-24 | Discharge: 2012-08-24 | Disposition: A | Discharge status: Home / Self Care | Payer: 59 | Source: Ambulatory Visit | Attending: Gastroenterology | Admitting: Gastroenterology

## 2012-08-24 DIAGNOSIS — K5150 Left sided colitis without complications: Secondary | ICD-10-CM | POA: Insufficient documentation

## 2012-08-24 MED ORDER — DIPHENHYDRAMINE HCL 25 MG PO TABS: 25.0000 mg | ORAL_TABLET | ORAL | Status: DC

## 2012-08-24 MED ORDER — SODIUM CHLORIDE 0.9 % IV SOLN
INTRAVENOUS | Status: DC
  Administered 2012-08-24: 250 mL via INTRAVENOUS

## 2012-08-24 MED ORDER — ACETAMINOPHEN 325 MG PO TABS: 650.0000 mg | ORAL_TABLET | ORAL | Status: DC

## 2012-08-24 MED ORDER — INFLIXIMAB 100 MG IV SOLR
5.0000 mg/kg | INTRAVENOUS | Status: DC
  Administered 2012-08-24: 400 mg via INTRAVENOUS
  Filled 2012-08-24: qty 40

## 2012-10-19 ENCOUNTER — Encounter (HOSPITAL_COMMUNITY)
Admission: RE | Admit: 2012-10-19 | Discharge: 2012-10-19 | Disposition: A | Discharge status: Home / Self Care | Payer: 59 | Source: Ambulatory Visit | Attending: Gastroenterology | Admitting: Gastroenterology

## 2012-10-19 DIAGNOSIS — K5150 Left sided colitis without complications: Secondary | ICD-10-CM | POA: Insufficient documentation

## 2012-10-19 MED ORDER — SODIUM CHLORIDE 0.9 % IV SOLN
5.0000 mg/kg | INTRAVENOUS | Status: DC
  Administered 2012-10-19: 400 mg via INTRAVENOUS
  Filled 2012-10-19: qty 40

## 2012-10-19 MED ORDER — DIPHENHYDRAMINE HCL 25 MG PO TABS
25.0000 mg | ORAL_TABLET | ORAL | Status: DC
  Filled 2012-10-19: qty 1

## 2012-10-19 MED ORDER — SODIUM CHLORIDE 0.9 % IV SOLN
INTRAVENOUS | Status: DC
  Administered 2012-10-19: 250 mL via INTRAVENOUS

## 2012-10-19 MED ORDER — ACETAMINOPHEN 325 MG PO TABS: 650.0000 mg | ORAL_TABLET | ORAL | Status: DC

## 2012-12-13 ENCOUNTER — Other Ambulatory Visit (HOSPITAL_COMMUNITY): Payer: Self-pay

## 2012-12-14 ENCOUNTER — Encounter (HOSPITAL_COMMUNITY)
Admission: RE | Admit: 2012-12-14 | Discharge: 2012-12-14 | Disposition: A | Discharge status: Home / Self Care | Payer: 59 | Source: Ambulatory Visit | Attending: Gastroenterology | Admitting: Gastroenterology

## 2012-12-14 DIAGNOSIS — K5150 Left sided colitis without complications: Secondary | ICD-10-CM | POA: Insufficient documentation

## 2012-12-14 MED ORDER — DIPHENHYDRAMINE HCL 25 MG PO TABS: 25.0000 mg | ORAL_TABLET | ORAL | Status: DC

## 2012-12-14 MED ORDER — ACETAMINOPHEN 325 MG PO TABS: 650.0000 mg | ORAL_TABLET | ORAL | Status: DC

## 2012-12-14 MED ORDER — SODIUM CHLORIDE 0.9 % IV SOLN
INTRAVENOUS | Status: DC
  Administered 2012-12-14: 10:00:00 via INTRAVENOUS

## 2012-12-14 MED ORDER — SODIUM CHLORIDE 0.9 % IV SOLN
5.0000 mg/kg | INTRAVENOUS | Status: DC
  Administered 2012-12-14: 400 mg via INTRAVENOUS
  Filled 2012-12-14: qty 40

## 2013-01-04 ENCOUNTER — Other Ambulatory Visit: Payer: Self-pay | Admitting: Gastroenterology

## 2013-02-08 ENCOUNTER — Encounter (HOSPITAL_COMMUNITY)
Admission: RE | Admit: 2013-02-08 | Discharge: 2013-02-08 | Disposition: A | Discharge status: Home / Self Care | Payer: 59 | Source: Ambulatory Visit | Attending: Gastroenterology | Admitting: Gastroenterology

## 2013-02-08 DIAGNOSIS — K5150 Left sided colitis without complications: Secondary | ICD-10-CM | POA: Insufficient documentation

## 2013-02-08 MED ORDER — DIPHENHYDRAMINE HCL 25 MG PO TABS
25.0000 mg | ORAL_TABLET | ORAL | Status: DC
  Filled 2013-02-08: qty 1

## 2013-02-08 MED ORDER — SODIUM CHLORIDE 0.9 % IV SOLN
INTRAVENOUS | Status: AC
  Administered 2013-02-08: 250 mL via INTRAVENOUS

## 2013-02-08 MED ORDER — ACETAMINOPHEN 325 MG PO TABS: 650.0000 mg | ORAL_TABLET | ORAL | Status: DC

## 2013-02-08 MED ORDER — SODIUM CHLORIDE 0.9 % IV SOLN
5.0000 mg/kg | INTRAVENOUS | Status: AC
  Administered 2013-02-08: 400 mg via INTRAVENOUS
  Filled 2013-02-08: qty 40

## 2013-04-04 ENCOUNTER — Other Ambulatory Visit (HOSPITAL_COMMUNITY): Payer: Self-pay

## 2013-04-05 ENCOUNTER — Encounter (HOSPITAL_COMMUNITY)
Admission: RE | Admit: 2013-04-05 | Discharge: 2013-04-05 | Disposition: A | Discharge status: Home / Self Care | Payer: 59 | Source: Ambulatory Visit | Attending: Gastroenterology | Admitting: Gastroenterology

## 2013-04-05 DIAGNOSIS — K5150 Left sided colitis without complications: Secondary | ICD-10-CM | POA: Insufficient documentation

## 2013-04-05 MED ORDER — ACETAMINOPHEN 325 MG PO TABS: 650.0000 mg | ORAL_TABLET | Freq: Once | ORAL | Status: DC

## 2013-04-05 MED ORDER — SODIUM CHLORIDE 0.9 % IV SOLN
INTRAVENOUS | Status: DC
  Administered 2013-04-05: 09:00:00 via INTRAVENOUS

## 2013-04-05 MED ORDER — INFLIXIMAB 100 MG IV SOLR
5.0000 mg/kg | Freq: Once | INTRAVENOUS | Status: DC
  Administered 2013-04-05: 400 mg via INTRAVENOUS
  Filled 2013-04-05: qty 40

## 2013-04-05 MED ORDER — DIPHENHYDRAMINE HCL 25 MG PO TABS
25.0000 mg | ORAL_TABLET | Freq: Once | ORAL | Status: DC
  Filled 2013-04-05: qty 1

## 2013-05-30 ENCOUNTER — Other Ambulatory Visit (HOSPITAL_COMMUNITY): Payer: Self-pay | Admitting: *Deleted

## 2013-05-31 ENCOUNTER — Encounter (HOSPITAL_COMMUNITY)
Admission: RE | Admit: 2013-05-31 | Discharge: 2013-05-31 | Disposition: A | Discharge status: Home / Self Care | Payer: 59 | Source: Ambulatory Visit | Attending: Gastroenterology | Admitting: Gastroenterology

## 2013-05-31 DIAGNOSIS — K5150 Left sided colitis without complications: Secondary | ICD-10-CM | POA: Insufficient documentation

## 2013-05-31 MED ORDER — ACETAMINOPHEN 325 MG PO TABS: 650.0000 mg | ORAL_TABLET | Freq: Once | ORAL | Status: DC

## 2013-05-31 MED ORDER — SODIUM CHLORIDE 0.9 % IV SOLN
INTRAVENOUS | Status: DC
  Administered 2013-05-31: 09:00:00 via INTRAVENOUS

## 2013-05-31 MED ORDER — DIPHENHYDRAMINE HCL 25 MG PO TABS
25.0000 mg | ORAL_TABLET | Freq: Once | ORAL | Status: DC
  Filled 2013-05-31: qty 1

## 2013-05-31 MED ORDER — INFLIXIMAB 100 MG IV SOLR
5.0000 mg/kg | Freq: Once | INTRAVENOUS | Status: AC
  Administered 2013-05-31: 400 mg via INTRAVENOUS
  Filled 2013-05-31: qty 40

## 2013-07-06 DIAGNOSIS — H4011X0 Primary open-angle glaucoma, stage unspecified: Secondary | ICD-10-CM | POA: Diagnosis not present

## 2013-07-06 DIAGNOSIS — H43819 Vitreous degeneration, unspecified eye: Secondary | ICD-10-CM | POA: Diagnosis not present

## 2013-07-25 ENCOUNTER — Other Ambulatory Visit (HOSPITAL_COMMUNITY): Payer: Self-pay | Admitting: *Deleted

## 2013-07-26 ENCOUNTER — Encounter (HOSPITAL_COMMUNITY)
Admission: RE | Admit: 2013-07-26 | Discharge: 2013-07-26 | Disposition: A | Discharge status: Home / Self Care | Payer: 59 | Source: Ambulatory Visit | Attending: Gastroenterology | Admitting: Gastroenterology

## 2013-07-26 DIAGNOSIS — K5150 Left sided colitis without complications: Secondary | ICD-10-CM | POA: Diagnosis not present

## 2013-07-26 MED ORDER — ACETAMINOPHEN 325 MG PO TABS: 650.0000 mg | ORAL_TABLET | ORAL | Status: DC

## 2013-07-26 MED ORDER — DIPHENHYDRAMINE HCL 25 MG PO TABS
25.0000 mg | ORAL_TABLET | ORAL | Status: DC
  Filled 2013-07-26: qty 1

## 2013-07-26 MED ORDER — SODIUM CHLORIDE 0.9 % IV SOLN
INTRAVENOUS | Status: DC
  Administered 2013-07-26: 09:00:00 via INTRAVENOUS

## 2013-07-26 MED ORDER — SODIUM CHLORIDE 0.9 % IV SOLN
5.0000 mg/kg | INTRAVENOUS | Status: DC
  Administered 2013-07-26: 400 mg via INTRAVENOUS
  Filled 2013-07-26: qty 40

## 2013-07-27 DIAGNOSIS — D649 Anemia, unspecified: Secondary | ICD-10-CM | POA: Diagnosis not present

## 2013-07-27 DIAGNOSIS — I10 Essential (primary) hypertension: Secondary | ICD-10-CM | POA: Diagnosis not present

## 2013-07-27 DIAGNOSIS — K5130 Ulcerative (chronic) rectosigmoiditis without complications: Secondary | ICD-10-CM | POA: Diagnosis not present

## 2013-07-27 DIAGNOSIS — Z23 Encounter for immunization: Secondary | ICD-10-CM | POA: Diagnosis not present

## 2013-07-27 DIAGNOSIS — E7800 Pure hypercholesterolemia, unspecified: Secondary | ICD-10-CM | POA: Diagnosis not present

## 2013-08-24 DIAGNOSIS — K5130 Ulcerative (chronic) rectosigmoiditis without complications: Secondary | ICD-10-CM | POA: Diagnosis not present

## 2013-09-20 ENCOUNTER — Encounter (HOSPITAL_COMMUNITY)
Admission: RE | Admit: 2013-09-20 | Discharge: 2013-09-20 | Disposition: A | Discharge status: Home / Self Care | Payer: 59 | Source: Ambulatory Visit | Attending: Gastroenterology | Admitting: Gastroenterology

## 2013-09-20 DIAGNOSIS — K5150 Left sided colitis without complications: Secondary | ICD-10-CM | POA: Insufficient documentation

## 2013-09-20 MED ORDER — ACETAMINOPHEN 325 MG PO TABS: 650.0000 mg | ORAL_TABLET | ORAL | Status: DC

## 2013-09-20 MED ORDER — SODIUM CHLORIDE 0.9 % IV SOLN
5.0000 mg/kg | INTRAVENOUS | Status: AC
  Administered 2013-09-20: 400 mg via INTRAVENOUS
  Filled 2013-09-20: qty 40

## 2013-09-20 MED ORDER — SODIUM CHLORIDE 0.9 % IV SOLN
INTRAVENOUS | Status: AC
  Administered 2013-09-20: 09:00:00 via INTRAVENOUS

## 2013-09-20 MED ORDER — DIPHENHYDRAMINE HCL 25 MG PO CAPS: 25.0000 mg | ORAL_CAPSULE | ORAL | Status: DC

## 2013-09-20 MED ORDER — DIPHENHYDRAMINE HCL 25 MG PO TABS
25.0000 mg | ORAL_TABLET | ORAL | Status: DC
  Filled 2013-09-20: qty 1

## 2013-09-27 DIAGNOSIS — H4011X0 Primary open-angle glaucoma, stage unspecified: Secondary | ICD-10-CM | POA: Diagnosis not present

## 2013-09-27 DIAGNOSIS — H2510 Age-related nuclear cataract, unspecified eye: Secondary | ICD-10-CM | POA: Diagnosis not present

## 2013-11-15 ENCOUNTER — Encounter (HOSPITAL_COMMUNITY)
Admission: RE | Admit: 2013-11-15 | Discharge: 2013-11-15 | Disposition: A | Discharge status: Home / Self Care | Payer: 59 | Source: Ambulatory Visit | Attending: Gastroenterology | Admitting: Gastroenterology

## 2013-11-15 DIAGNOSIS — K5150 Left sided colitis without complications: Secondary | ICD-10-CM | POA: Insufficient documentation

## 2013-11-15 MED ORDER — SODIUM CHLORIDE 0.9 % IV SOLN
5.0000 mg/kg | Freq: Once | INTRAVENOUS | Status: AC
  Administered 2013-11-15: 400 mg via INTRAVENOUS
  Filled 2013-11-15: qty 40

## 2013-11-15 MED ORDER — SODIUM CHLORIDE 0.9 % IV SOLN
Freq: Once | INTRAVENOUS | Status: AC
  Administered 2013-11-15: 09:00:00 via INTRAVENOUS

## 2013-11-15 MED ORDER — ACETAMINOPHEN 325 MG PO TABS: 650.0000 mg | ORAL_TABLET | Freq: Once | ORAL | Status: DC

## 2013-11-15 MED ORDER — DIPHENHYDRAMINE HCL 25 MG PO CAPS: 25.0000 mg | ORAL_CAPSULE | Freq: Once | ORAL | Status: DC

## 2013-12-13 DIAGNOSIS — K5150 Left sided colitis without complications: Secondary | ICD-10-CM | POA: Diagnosis not present

## 2013-12-13 DIAGNOSIS — R319 Hematuria, unspecified: Secondary | ICD-10-CM | POA: Diagnosis not present

## 2014-01-10 ENCOUNTER — Encounter (HOSPITAL_COMMUNITY)
Admission: RE | Admit: 2014-01-10 | Discharge: 2014-01-10 | Disposition: A | Discharge status: Home / Self Care | Payer: 59 | Source: Ambulatory Visit | Attending: Gastroenterology | Admitting: Gastroenterology

## 2014-01-10 DIAGNOSIS — K5190 Ulcerative colitis, unspecified, without complications: Secondary | ICD-10-CM | POA: Diagnosis present

## 2014-01-10 MED ORDER — SODIUM CHLORIDE 0.9 % IV SOLN
INTRAVENOUS | Status: DC
  Administered 2014-01-10: 09:00:00 via INTRAVENOUS

## 2014-01-10 MED ORDER — SODIUM CHLORIDE 0.9 % IV SOLN
5.0000 mg/kg | INTRAVENOUS | Status: DC
  Administered 2014-01-10: 400 mg via INTRAVENOUS
  Filled 2014-01-10: qty 40

## 2014-01-10 MED ORDER — ACETAMINOPHEN 325 MG PO TABS
650.0000 mg | ORAL_TABLET | Freq: Four times a day (QID) | ORAL | Status: DC | PRN
Start: 2014-01-10 — End: 2014-01-11

## 2014-01-10 MED ORDER — DIPHENHYDRAMINE HCL 25 MG PO TABS
25.0000 mg | ORAL_TABLET | Freq: Once | ORAL | Status: DC
  Filled 2014-01-10: qty 1

## 2014-01-20 ENCOUNTER — Other Ambulatory Visit: Payer: Self-pay | Admitting: Family Medicine

## 2014-01-20 ENCOUNTER — Ambulatory Visit
Admission: RE | Admit: 2014-01-20 | Discharge: 2014-01-20 | Disposition: A | Discharge status: Home / Self Care | Payer: 59 | Source: Ambulatory Visit | Attending: Family Medicine | Admitting: Family Medicine

## 2014-01-20 DIAGNOSIS — M542 Cervicalgia: Secondary | ICD-10-CM

## 2014-03-07 ENCOUNTER — Encounter (HOSPITAL_COMMUNITY)
Admission: RE | Admit: 2014-03-07 | Discharge: 2014-03-07 | Disposition: A | Discharge status: Home / Self Care | Payer: 59 | Source: Ambulatory Visit | Attending: Gastroenterology | Admitting: Gastroenterology

## 2014-03-07 DIAGNOSIS — K5190 Ulcerative colitis, unspecified, without complications: Secondary | ICD-10-CM | POA: Insufficient documentation

## 2014-03-07 MED ORDER — SODIUM CHLORIDE 0.9 % IV SOLN
5.0000 mg/kg | INTRAVENOUS | Status: DC
  Administered 2014-03-07: 400 mg via INTRAVENOUS
  Filled 2014-03-07: qty 40

## 2014-03-07 MED ORDER — SODIUM CHLORIDE 0.9 % IV SOLN: 5.0000 mg/kg | INTRAVENOUS | Status: DC

## 2014-03-07 MED ORDER — DIPHENHYDRAMINE HCL 25 MG PO CAPS: 25.0000 mg | ORAL_CAPSULE | Freq: Once | ORAL | Status: DC

## 2014-03-07 MED ORDER — SODIUM CHLORIDE 0.9 % IV SOLN
INTRAVENOUS | Status: DC
  Administered 2014-03-07: 250 mL via INTRAVENOUS

## 2014-03-07 MED ORDER — ACETAMINOPHEN 325 MG PO TABS: 650.0000 mg | ORAL_TABLET | Freq: Four times a day (QID) | ORAL | Status: DC | PRN

## 2014-03-07 MED ORDER — DIPHENHYDRAMINE HCL 25 MG PO TABS
25.0000 mg | ORAL_TABLET | Freq: Once | ORAL | Status: DC
  Filled 2014-03-07: qty 1

## 2014-05-02 ENCOUNTER — Encounter (HOSPITAL_COMMUNITY)
Admission: RE | Admit: 2014-05-02 | Discharge: 2014-05-02 | Disposition: A | Discharge status: Home / Self Care | Payer: 59 | Source: Ambulatory Visit | Attending: Gastroenterology | Admitting: Gastroenterology

## 2014-05-02 DIAGNOSIS — K5190 Ulcerative colitis, unspecified, without complications: Secondary | ICD-10-CM | POA: Diagnosis not present

## 2014-05-02 MED ORDER — SODIUM CHLORIDE 0.9 % IV SOLN
INTRAVENOUS | Status: DC
  Administered 2014-05-02: 250 mL via INTRAVENOUS

## 2014-05-02 MED ORDER — ACETAMINOPHEN 325 MG PO TABS: 650.0000 mg | ORAL_TABLET | Freq: Once | ORAL | Status: DC

## 2014-05-02 MED ORDER — DIPHENHYDRAMINE HCL 25 MG PO CAPS: 25.0000 mg | ORAL_CAPSULE | Freq: Once | ORAL | Status: DC

## 2014-05-02 MED ORDER — INFLIXIMAB 100 MG IV SOLR
5.0000 mg/kg | INTRAVENOUS | Status: DC
  Administered 2014-05-02: 400 mg via INTRAVENOUS
  Filled 2014-05-02: qty 40

## 2014-05-31 DIAGNOSIS — K51219 Ulcerative (chronic) proctitis with unspecified complications: Secondary | ICD-10-CM | POA: Diagnosis not present

## 2014-06-26 ENCOUNTER — Other Ambulatory Visit (HOSPITAL_COMMUNITY): Payer: Self-pay | Admitting: *Deleted

## 2014-06-27 ENCOUNTER — Encounter (HOSPITAL_COMMUNITY)
Admission: RE | Admit: 2014-06-27 | Discharge: 2014-06-27 | Disposition: A | Discharge status: Home / Self Care | Payer: 59 | Source: Ambulatory Visit | Attending: Gastroenterology | Admitting: Gastroenterology

## 2014-06-27 DIAGNOSIS — K5190 Ulcerative colitis, unspecified, without complications: Secondary | ICD-10-CM | POA: Diagnosis not present

## 2014-06-27 MED ORDER — ACETAMINOPHEN 325 MG PO TABS: 650.0000 mg | ORAL_TABLET | Freq: Once | ORAL | Status: DC

## 2014-06-27 MED ORDER — DIPHENHYDRAMINE HCL 25 MG PO CAPS: 25.0000 mg | ORAL_CAPSULE | Freq: Once | ORAL | Status: DC

## 2014-06-27 MED ORDER — SODIUM CHLORIDE 0.9 % IV SOLN
5.0000 mg/kg | INTRAVENOUS | Status: AC
  Administered 2014-06-27: 400 mg via INTRAVENOUS
  Filled 2014-06-27: qty 40

## 2014-06-27 MED ORDER — SODIUM CHLORIDE 0.9 % IV SOLN
INTRAVENOUS | Status: DC
  Administered 2014-06-27: 09:00:00 via INTRAVENOUS

## 2014-07-25 DIAGNOSIS — H4011X2 Primary open-angle glaucoma, moderate stage: Secondary | ICD-10-CM | POA: Diagnosis not present

## 2014-07-25 DIAGNOSIS — H43813 Vitreous degeneration, bilateral: Secondary | ICD-10-CM | POA: Diagnosis not present

## 2014-07-25 DIAGNOSIS — H2513 Age-related nuclear cataract, bilateral: Secondary | ICD-10-CM | POA: Diagnosis not present

## 2014-08-12 DIAGNOSIS — Z471 Aftercare following joint replacement surgery: Secondary | ICD-10-CM | POA: Diagnosis not present

## 2014-08-12 DIAGNOSIS — Z96651 Presence of right artificial knee joint: Secondary | ICD-10-CM | POA: Diagnosis not present

## 2014-08-14 DIAGNOSIS — T84098A Other mechanical complication of other internal joint prosthesis, initial encounter: Secondary | ICD-10-CM | POA: Diagnosis not present

## 2014-08-14 DIAGNOSIS — Z96611 Presence of right artificial shoulder joint: Secondary | ICD-10-CM | POA: Diagnosis not present

## 2014-08-21 ENCOUNTER — Other Ambulatory Visit (HOSPITAL_COMMUNITY): Payer: Self-pay | Admitting: *Deleted

## 2014-08-22 ENCOUNTER — Encounter (HOSPITAL_COMMUNITY)
Admission: RE | Admit: 2014-08-22 | Discharge: 2014-08-22 | Disposition: A | Discharge status: Home / Self Care | Payer: 59 | Source: Ambulatory Visit | Attending: Gastroenterology | Admitting: Gastroenterology

## 2014-08-22 DIAGNOSIS — K5190 Ulcerative colitis, unspecified, without complications: Secondary | ICD-10-CM | POA: Diagnosis not present

## 2014-08-22 MED ORDER — ACETAMINOPHEN 325 MG PO TABS: 650.0000 mg | ORAL_TABLET | Freq: Four times a day (QID) | ORAL | Status: DC | PRN

## 2014-08-22 MED ORDER — INFLIXIMAB 100 MG IV SOLR
5.0000 mg/kg | INTRAVENOUS | Status: DC
  Administered 2014-08-22: 400 mg via INTRAVENOUS
  Filled 2014-08-22: qty 40

## 2014-08-22 MED ORDER — DIPHENHYDRAMINE HCL 25 MG PO CAPS
25.0000 mg | ORAL_CAPSULE | ORAL | Status: DC
Start: 2014-08-22 — End: 2014-08-23

## 2014-08-22 MED ORDER — SODIUM CHLORIDE 0.9 % IV SOLN
INTRAVENOUS | Status: DC
  Administered 2014-08-22: 250 mL via INTRAVENOUS

## 2014-08-28 DIAGNOSIS — K130 Diseases of lips: Secondary | ICD-10-CM | POA: Diagnosis not present

## 2014-08-28 DIAGNOSIS — T783XXA Angioneurotic edema, initial encounter: Secondary | ICD-10-CM | POA: Diagnosis not present

## 2014-10-16 ENCOUNTER — Other Ambulatory Visit (HOSPITAL_COMMUNITY): Payer: Self-pay | Admitting: *Deleted

## 2014-10-17 ENCOUNTER — Encounter (HOSPITAL_COMMUNITY)
Admission: RE | Admit: 2014-10-17 | Discharge: 2014-10-17 | Disposition: A | Discharge status: Home / Self Care | Payer: 59 | Source: Ambulatory Visit | Attending: Gastroenterology | Admitting: Gastroenterology

## 2014-10-17 DIAGNOSIS — K5190 Ulcerative colitis, unspecified, without complications: Secondary | ICD-10-CM | POA: Diagnosis present

## 2014-10-17 MED ORDER — SODIUM CHLORIDE 0.9 % IV SOLN: INTRAVENOUS | Status: DC

## 2014-10-17 MED ORDER — SODIUM CHLORIDE 0.9 % IV SOLN
5.0000 mg/kg | INTRAVENOUS | Status: AC
  Administered 2014-10-17: 400 mg via INTRAVENOUS
  Filled 2014-10-17: qty 40

## 2014-10-17 MED ORDER — ACETAMINOPHEN 325 MG PO TABS: 650.0000 mg | ORAL_TABLET | Freq: Four times a day (QID) | ORAL | Status: DC | PRN

## 2014-10-17 MED ORDER — DIPHENHYDRAMINE HCL 25 MG PO CAPS: 25.0000 mg | ORAL_CAPSULE | ORAL | Status: DC

## 2014-12-12 ENCOUNTER — Encounter (HOSPITAL_COMMUNITY)
Admission: RE | Admit: 2014-12-12 | Discharge: 2014-12-12 | Disposition: A | Discharge status: Home / Self Care | Payer: 59 | Source: Ambulatory Visit | Attending: Gastroenterology | Admitting: Gastroenterology

## 2014-12-12 DIAGNOSIS — K5190 Ulcerative colitis, unspecified, without complications: Secondary | ICD-10-CM | POA: Insufficient documentation

## 2014-12-12 MED ORDER — DIPHENHYDRAMINE HCL 25 MG PO CAPS: 25.0000 mg | ORAL_CAPSULE | Freq: Once | ORAL | Status: DC

## 2014-12-12 MED ORDER — ACETAMINOPHEN 325 MG PO TABS: 650.0000 mg | ORAL_TABLET | Freq: Once | ORAL | Status: DC

## 2014-12-12 MED ORDER — INFLIXIMAB 100 MG IV SOLR
5.0000 mg/kg | INTRAVENOUS | Status: DC
  Administered 2014-12-12: 400 mg via INTRAVENOUS
  Filled 2014-12-12: qty 40

## 2015-02-06 ENCOUNTER — Other Ambulatory Visit (HOSPITAL_COMMUNITY): Payer: Self-pay | Admitting: *Deleted

## 2015-02-07 ENCOUNTER — Encounter (HOSPITAL_COMMUNITY)
Admission: RE | Admit: 2015-02-07 | Discharge: 2015-02-07 | Disposition: A | Discharge status: Home / Self Care | Payer: 59 | Source: Ambulatory Visit | Attending: Gastroenterology | Admitting: Gastroenterology

## 2015-02-07 DIAGNOSIS — K5190 Ulcerative colitis, unspecified, without complications: Secondary | ICD-10-CM | POA: Insufficient documentation

## 2015-02-07 MED ORDER — DIPHENHYDRAMINE HCL 25 MG PO CAPS
25.0000 mg | ORAL_CAPSULE | Freq: Once | ORAL | Status: DC
Start: 2015-02-07 — End: 2015-02-08

## 2015-02-07 MED ORDER — SODIUM CHLORIDE 0.9 % IV SOLN
5.0000 mg/kg | INTRAVENOUS | Status: DC
  Administered 2015-02-07: 400 mg via INTRAVENOUS
  Filled 2015-02-07: qty 40

## 2015-02-07 MED ORDER — SODIUM CHLORIDE 0.9 % IV SOLN
INTRAVENOUS | Status: DC
Start: 2015-02-07 — End: 2015-02-08
  Administered 2015-02-07: 09:00:00 via INTRAVENOUS

## 2015-02-07 MED ORDER — ACETAMINOPHEN 325 MG PO TABS: 650.0000 mg | ORAL_TABLET | Freq: Once | ORAL | Status: DC

## 2015-02-26 ENCOUNTER — Other Ambulatory Visit: Payer: Self-pay | Admitting: Gastroenterology

## 2015-04-04 ENCOUNTER — Encounter (HOSPITAL_COMMUNITY): Payer: Medicare Other

## 2015-04-13 DIAGNOSIS — Z96611 Presence of right artificial shoulder joint: Secondary | ICD-10-CM | POA: Diagnosis not present

## 2015-04-13 DIAGNOSIS — Z471 Aftercare following joint replacement surgery: Secondary | ICD-10-CM | POA: Diagnosis not present

## 2015-05-04 DIAGNOSIS — K51219 Ulcerative (chronic) proctitis with unspecified complications: Secondary | ICD-10-CM | POA: Diagnosis not present

## 2015-08-03 DIAGNOSIS — H43813 Vitreous degeneration, bilateral: Secondary | ICD-10-CM | POA: Diagnosis not present

## 2015-08-03 DIAGNOSIS — H401132 Primary open-angle glaucoma, bilateral, moderate stage: Secondary | ICD-10-CM | POA: Diagnosis not present

## 2015-08-03 DIAGNOSIS — H47329 Drusen of optic disc, unspecified eye: Secondary | ICD-10-CM | POA: Diagnosis not present

## 2015-09-12 DIAGNOSIS — Z125 Encounter for screening for malignant neoplasm of prostate: Secondary | ICD-10-CM | POA: Diagnosis not present

## 2015-09-12 DIAGNOSIS — E7800 Pure hypercholesterolemia, unspecified: Secondary | ICD-10-CM | POA: Diagnosis not present

## 2015-09-12 DIAGNOSIS — I10 Essential (primary) hypertension: Secondary | ICD-10-CM | POA: Diagnosis not present

## 2015-09-12 DIAGNOSIS — K51219 Ulcerative (chronic) proctitis with unspecified complications: Secondary | ICD-10-CM | POA: Diagnosis not present

## 2015-09-12 DIAGNOSIS — R8290 Unspecified abnormal findings in urine: Secondary | ICD-10-CM | POA: Diagnosis not present

## 2015-09-12 DIAGNOSIS — H4089 Other specified glaucoma: Secondary | ICD-10-CM | POA: Diagnosis not present

## 2015-09-18 DIAGNOSIS — R7301 Impaired fasting glucose: Secondary | ICD-10-CM | POA: Diagnosis not present

## 2015-09-18 DIAGNOSIS — Z23 Encounter for immunization: Secondary | ICD-10-CM | POA: Diagnosis not present

## 2015-11-13 DIAGNOSIS — K51219 Ulcerative (chronic) proctitis with unspecified complications: Secondary | ICD-10-CM | POA: Diagnosis not present

## 2015-12-03 DIAGNOSIS — H401132 Primary open-angle glaucoma, bilateral, moderate stage: Secondary | ICD-10-CM | POA: Diagnosis not present

## 2015-12-03 DIAGNOSIS — H43813 Vitreous degeneration, bilateral: Secondary | ICD-10-CM | POA: Diagnosis not present

## 2015-12-03 DIAGNOSIS — H47329 Drusen of optic disc, unspecified eye: Secondary | ICD-10-CM | POA: Diagnosis not present

## 2015-12-25 DIAGNOSIS — K51219 Ulcerative (chronic) proctitis with unspecified complications: Secondary | ICD-10-CM | POA: Diagnosis not present

## 2015-12-31 DIAGNOSIS — K51219 Ulcerative (chronic) proctitis with unspecified complications: Secondary | ICD-10-CM | POA: Diagnosis not present

## 2016-01-03 ENCOUNTER — Ambulatory Visit (HOSPITAL_COMMUNITY): Admission: RE | Admit: 2016-01-03 | Payer: PPO | Source: Ambulatory Visit

## 2016-01-17 DIAGNOSIS — K51219 Ulcerative (chronic) proctitis with unspecified complications: Secondary | ICD-10-CM | POA: Diagnosis not present

## 2016-01-24 ENCOUNTER — Other Ambulatory Visit (HOSPITAL_COMMUNITY): Payer: Self-pay | Admitting: *Deleted

## 2016-01-25 ENCOUNTER — Ambulatory Visit (HOSPITAL_COMMUNITY)
Admission: RE | Admit: 2016-01-25 | Discharge: 2016-01-25 | Disposition: A | Discharge status: Home / Self Care | Payer: PPO | Source: Ambulatory Visit | Attending: Gastroenterology | Admitting: Gastroenterology

## 2016-01-25 DIAGNOSIS — Z23 Encounter for immunization: Secondary | ICD-10-CM | POA: Diagnosis not present

## 2016-01-25 DIAGNOSIS — K5190 Ulcerative colitis, unspecified, without complications: Secondary | ICD-10-CM | POA: Diagnosis not present

## 2016-01-25 MED ORDER — ACETAMINOPHEN 325 MG PO TABS: 650.0000 mg | ORAL_TABLET | Freq: Once | ORAL | Status: DC

## 2016-01-25 MED ORDER — DIPHENHYDRAMINE HCL 25 MG PO CAPS
25.0000 mg | ORAL_CAPSULE | Freq: Once | ORAL | Status: DC
Start: 2016-01-25 — End: 2016-01-26

## 2016-01-25 MED ORDER — SODIUM CHLORIDE 0.9 % IV SOLN
5.0000 mg/kg | INTRAVENOUS | Status: DC
  Administered 2016-01-25: 400 mg via INTRAVENOUS
  Filled 2016-01-25: qty 40

## 2016-01-25 MED ORDER — SODIUM CHLORIDE 0.9 % IV SOLN
INTRAVENOUS | Status: DC
  Administered 2016-01-25: 10:00:00 via INTRAVENOUS

## 2016-03-03 DIAGNOSIS — K51219 Ulcerative (chronic) proctitis with unspecified complications: Secondary | ICD-10-CM | POA: Diagnosis not present

## 2016-03-20 ENCOUNTER — Other Ambulatory Visit (HOSPITAL_COMMUNITY): Payer: Self-pay | Admitting: *Deleted

## 2016-03-21 ENCOUNTER — Ambulatory Visit (HOSPITAL_COMMUNITY)
Admission: RE | Admit: 2016-03-21 | Discharge: 2016-03-21 | Disposition: A | Discharge status: Home / Self Care | Payer: PPO | Source: Ambulatory Visit | Attending: Gastroenterology | Admitting: Gastroenterology

## 2016-03-21 DIAGNOSIS — K5190 Ulcerative colitis, unspecified, without complications: Secondary | ICD-10-CM | POA: Diagnosis not present

## 2016-03-21 MED ORDER — ACETAMINOPHEN 325 MG PO TABS: 650.0000 mg | ORAL_TABLET | ORAL | Status: DC

## 2016-03-21 MED ORDER — DIPHENHYDRAMINE HCL 25 MG PO CAPS: 25.0000 mg | ORAL_CAPSULE | ORAL | Status: DC

## 2016-03-21 MED ORDER — SODIUM CHLORIDE 0.9 % IV SOLN
INTRAVENOUS | Status: DC
Start: 2016-03-21 — End: 2016-03-22

## 2016-03-21 MED ORDER — SODIUM CHLORIDE 0.9 % IV SOLN
5.0000 mg/kg | INTRAVENOUS | Status: AC
  Administered 2016-03-21: 400 mg via INTRAVENOUS
  Filled 2016-03-21: qty 40

## 2016-04-09 DIAGNOSIS — H401132 Primary open-angle glaucoma, bilateral, moderate stage: Secondary | ICD-10-CM | POA: Diagnosis not present

## 2016-04-09 DIAGNOSIS — H47329 Drusen of optic disc, unspecified eye: Secondary | ICD-10-CM | POA: Diagnosis not present

## 2016-04-09 DIAGNOSIS — H2513 Age-related nuclear cataract, bilateral: Secondary | ICD-10-CM | POA: Diagnosis not present

## 2016-04-29 DIAGNOSIS — K51219 Ulcerative (chronic) proctitis with unspecified complications: Secondary | ICD-10-CM | POA: Diagnosis not present

## 2016-05-15 ENCOUNTER — Other Ambulatory Visit (HOSPITAL_COMMUNITY): Payer: Self-pay | Admitting: *Deleted

## 2016-05-16 ENCOUNTER — Ambulatory Visit (HOSPITAL_COMMUNITY)
Admission: RE | Admit: 2016-05-16 | Discharge: 2016-05-16 | Disposition: A | Discharge status: Home / Self Care | Payer: PPO | Source: Ambulatory Visit | Attending: Gastroenterology | Admitting: Gastroenterology

## 2016-05-16 DIAGNOSIS — K51219 Ulcerative (chronic) proctitis with unspecified complications: Secondary | ICD-10-CM | POA: Insufficient documentation

## 2016-05-16 MED ORDER — ACETAMINOPHEN 325 MG PO TABS: 650.0000 mg | ORAL_TABLET | ORAL | Status: DC

## 2016-05-16 MED ORDER — DIPHENHYDRAMINE HCL 25 MG PO CAPS: 25.0000 mg | ORAL_CAPSULE | ORAL | Status: DC

## 2016-05-16 MED ORDER — SODIUM CHLORIDE 0.9 % IV SOLN
5.0000 mg/kg | INTRAVENOUS | Status: DC
  Administered 2016-05-16: 400 mg via INTRAVENOUS
  Filled 2016-05-16: qty 40

## 2016-07-10 ENCOUNTER — Other Ambulatory Visit (HOSPITAL_COMMUNITY): Payer: Self-pay | Admitting: *Deleted

## 2016-07-11 ENCOUNTER — Ambulatory Visit (HOSPITAL_COMMUNITY)
Admission: RE | Admit: 2016-07-11 | Discharge: 2016-07-11 | Disposition: A | Discharge status: Home / Self Care | Payer: PPO | Source: Ambulatory Visit | Attending: Gastroenterology | Admitting: Gastroenterology

## 2016-07-11 DIAGNOSIS — K51219 Ulcerative (chronic) proctitis with unspecified complications: Secondary | ICD-10-CM | POA: Insufficient documentation

## 2016-07-11 MED ORDER — DIPHENHYDRAMINE HCL 25 MG PO CAPS: 25.0000 mg | ORAL_CAPSULE | ORAL | Status: DC

## 2016-07-11 MED ORDER — INFLIXIMAB 100 MG IV SOLR
5.0000 mg/kg | INTRAVENOUS | Status: DC
Start: 2016-07-11 — End: 2016-07-12
  Administered 2016-07-11: 400 mg via INTRAVENOUS
  Filled 2016-07-11: qty 40

## 2016-07-11 MED ORDER — ACETAMINOPHEN 325 MG PO TABS: 650.0000 mg | ORAL_TABLET | ORAL | Status: DC

## 2016-08-04 DIAGNOSIS — K51219 Ulcerative (chronic) proctitis with unspecified complications: Secondary | ICD-10-CM | POA: Diagnosis not present

## 2016-08-12 DIAGNOSIS — H401132 Primary open-angle glaucoma, bilateral, moderate stage: Secondary | ICD-10-CM | POA: Diagnosis not present

## 2016-08-12 DIAGNOSIS — H47329 Drusen of optic disc, unspecified eye: Secondary | ICD-10-CM | POA: Diagnosis not present

## 2016-08-12 DIAGNOSIS — H2513 Age-related nuclear cataract, bilateral: Secondary | ICD-10-CM | POA: Diagnosis not present

## 2016-08-29 ENCOUNTER — Ambulatory Visit (HOSPITAL_COMMUNITY)
Admission: RE | Admit: 2016-08-29 | Discharge: 2016-08-29 | Disposition: A | Discharge status: Home / Self Care | Payer: PPO | Source: Ambulatory Visit | Attending: Gastroenterology | Admitting: Gastroenterology

## 2016-08-29 DIAGNOSIS — K51219 Ulcerative (chronic) proctitis with unspecified complications: Secondary | ICD-10-CM | POA: Insufficient documentation

## 2016-08-29 MED ORDER — ACETAMINOPHEN 325 MG PO TABS: 650.0000 mg | ORAL_TABLET | ORAL | Status: DC

## 2016-08-29 MED ORDER — INFLIXIMAB 100 MG IV SOLR
5.0000 mg/kg | INTRAVENOUS | Status: DC
  Administered 2016-08-29: 400 mg via INTRAVENOUS
  Filled 2016-08-29: qty 40

## 2016-08-29 MED ORDER — SODIUM CHLORIDE 0.9 % IV SOLN
Freq: Once | INTRAVENOUS | Status: AC
Start: 2016-08-29 — End: 2016-08-29
  Administered 2016-08-29: 09:00:00 via INTRAVENOUS

## 2016-08-29 MED ORDER — DIPHENHYDRAMINE HCL 25 MG PO CAPS: 25.0000 mg | ORAL_CAPSULE | ORAL | Status: DC

## 2016-08-29 MED ORDER — SODIUM CHLORIDE 0.9 % IV SOLN
5.0000 mg/kg | INTRAVENOUS | Status: DC
  Filled 2016-08-29 (×2): qty 40

## 2016-10-23 ENCOUNTER — Other Ambulatory Visit (HOSPITAL_COMMUNITY): Payer: Self-pay | Admitting: *Deleted

## 2016-10-24 ENCOUNTER — Ambulatory Visit (HOSPITAL_COMMUNITY)
Admission: RE | Admit: 2016-10-24 | Discharge: 2016-10-24 | Disposition: A | Discharge status: Home / Self Care | Payer: PPO | Source: Ambulatory Visit | Attending: Gastroenterology | Admitting: Gastroenterology

## 2016-10-24 DIAGNOSIS — K51219 Ulcerative (chronic) proctitis with unspecified complications: Secondary | ICD-10-CM | POA: Diagnosis not present

## 2016-10-24 MED ORDER — SODIUM CHLORIDE 0.9 % IV SOLN
5.0000 mg/kg | INTRAVENOUS | Status: DC
  Administered 2016-10-24: 400 mg via INTRAVENOUS
  Filled 2016-10-24: qty 40

## 2016-10-24 MED ORDER — SODIUM CHLORIDE 0.9 % IV SOLN
Freq: Once | INTRAVENOUS | Status: AC
  Administered 2016-10-24: 09:00:00 via INTRAVENOUS

## 2016-10-24 MED ORDER — DIPHENHYDRAMINE HCL 25 MG PO CAPS: 25.0000 mg | ORAL_CAPSULE | ORAL | Status: DC

## 2016-10-24 MED ORDER — ACETAMINOPHEN 325 MG PO TABS: 650.0000 mg | ORAL_TABLET | ORAL | Status: DC

## 2016-11-11 DIAGNOSIS — K51219 Ulcerative (chronic) proctitis with unspecified complications: Secondary | ICD-10-CM | POA: Diagnosis not present

## 2016-12-17 DIAGNOSIS — K51219 Ulcerative (chronic) proctitis with unspecified complications: Secondary | ICD-10-CM | POA: Diagnosis not present

## 2016-12-17 DIAGNOSIS — K625 Hemorrhage of anus and rectum: Secondary | ICD-10-CM | POA: Diagnosis not present

## 2016-12-19 ENCOUNTER — Ambulatory Visit (HOSPITAL_COMMUNITY): Admission: RE | Admit: 2016-12-19 | Payer: PPO | Source: Ambulatory Visit

## 2016-12-24 ENCOUNTER — Ambulatory Visit (HOSPITAL_COMMUNITY)
Admission: RE | Admit: 2016-12-24 | Discharge: 2016-12-24 | Disposition: A | Discharge status: Home / Self Care | Payer: PPO | Source: Ambulatory Visit | Attending: Gastroenterology | Admitting: Gastroenterology

## 2016-12-24 DIAGNOSIS — K51219 Ulcerative (chronic) proctitis with unspecified complications: Secondary | ICD-10-CM | POA: Insufficient documentation

## 2016-12-24 MED ORDER — SODIUM CHLORIDE 0.9 % IV SOLN
Freq: Once | INTRAVENOUS | Status: AC
  Administered 2016-12-24: 09:00:00 via INTRAVENOUS

## 2016-12-24 MED ORDER — ACETAMINOPHEN 325 MG PO TABS: 650.0000 mg | ORAL_TABLET | ORAL | Status: DC

## 2016-12-24 MED ORDER — SODIUM CHLORIDE 0.9 % IV SOLN
5.0000 mg/kg | INTRAVENOUS | Status: DC
  Administered 2016-12-24: 400 mg via INTRAVENOUS
  Filled 2016-12-24: qty 40

## 2016-12-24 MED ORDER — DIPHENHYDRAMINE HCL 25 MG PO CAPS: 25.0000 mg | ORAL_CAPSULE | ORAL | Status: DC

## 2017-01-14 DIAGNOSIS — K51219 Ulcerative (chronic) proctitis with unspecified complications: Secondary | ICD-10-CM | POA: Diagnosis not present

## 2017-01-28 DIAGNOSIS — H4089 Other specified glaucoma: Secondary | ICD-10-CM | POA: Diagnosis not present

## 2017-01-28 DIAGNOSIS — E7800 Pure hypercholesterolemia, unspecified: Secondary | ICD-10-CM | POA: Diagnosis not present

## 2017-01-28 DIAGNOSIS — Z125 Encounter for screening for malignant neoplasm of prostate: Secondary | ICD-10-CM | POA: Diagnosis not present

## 2017-01-28 DIAGNOSIS — K51219 Ulcerative (chronic) proctitis with unspecified complications: Secondary | ICD-10-CM | POA: Diagnosis not present

## 2017-01-28 DIAGNOSIS — Z23 Encounter for immunization: Secondary | ICD-10-CM | POA: Diagnosis not present

## 2017-01-28 DIAGNOSIS — I10 Essential (primary) hypertension: Secondary | ICD-10-CM | POA: Diagnosis not present

## 2017-02-12 DIAGNOSIS — K51219 Ulcerative (chronic) proctitis with unspecified complications: Secondary | ICD-10-CM | POA: Diagnosis not present

## 2017-02-17 ENCOUNTER — Other Ambulatory Visit (HOSPITAL_COMMUNITY): Payer: Self-pay | Admitting: *Deleted

## 2017-02-18 ENCOUNTER — Ambulatory Visit (HOSPITAL_COMMUNITY): Admission: RE | Admit: 2017-02-18 | Payer: PPO | Source: Ambulatory Visit

## 2017-03-12 DIAGNOSIS — R972 Elevated prostate specific antigen [PSA]: Secondary | ICD-10-CM | POA: Diagnosis not present

## 2017-03-12 DIAGNOSIS — R3912 Poor urinary stream: Secondary | ICD-10-CM | POA: Diagnosis not present

## 2017-03-12 DIAGNOSIS — N401 Enlarged prostate with lower urinary tract symptoms: Secondary | ICD-10-CM | POA: Diagnosis not present

## 2017-03-12 DIAGNOSIS — R3121 Asymptomatic microscopic hematuria: Secondary | ICD-10-CM | POA: Diagnosis not present

## 2017-04-14 DIAGNOSIS — N4232 Atypical small acinar proliferation of prostate: Secondary | ICD-10-CM | POA: Diagnosis not present

## 2017-04-14 DIAGNOSIS — R972 Elevated prostate specific antigen [PSA]: Secondary | ICD-10-CM | POA: Diagnosis not present

## 2017-04-21 DIAGNOSIS — N4232 Atypical small acinar proliferation of prostate: Secondary | ICD-10-CM | POA: Diagnosis not present

## 2017-04-21 DIAGNOSIS — R972 Elevated prostate specific antigen [PSA]: Secondary | ICD-10-CM | POA: Diagnosis not present

## 2017-05-11 DIAGNOSIS — K51219 Ulcerative (chronic) proctitis with unspecified complications: Secondary | ICD-10-CM | POA: Diagnosis not present

## 2017-09-09 DIAGNOSIS — H04123 Dry eye syndrome of bilateral lacrimal glands: Secondary | ICD-10-CM | POA: Diagnosis not present

## 2017-09-09 DIAGNOSIS — H40033 Anatomical narrow angle, bilateral: Secondary | ICD-10-CM | POA: Diagnosis not present

## 2017-10-13 DIAGNOSIS — R972 Elevated prostate specific antigen [PSA]: Secondary | ICD-10-CM | POA: Diagnosis not present

## 2017-10-13 DIAGNOSIS — N4232 Atypical small acinar proliferation of prostate: Secondary | ICD-10-CM | POA: Diagnosis not present

## 2017-10-21 ENCOUNTER — Other Ambulatory Visit: Payer: Self-pay

## 2017-10-21 NOTE — Patient Outreach (Signed)
[facility]) Care Management  10/21/2017  Levi Turner [birth year] [id number]   Referral Date:10/21/17 Referral Source: HTA Referral Reason: Requesting assistance with medication Xiidra 5%.   Outreach Attempt #1 Telephone call to patient. No answer.  HIPAA compliant voice message left.   Plan: RN CM will send letter and attempt patient again within 4 business days.  [name], RN, MSN [facility] Care Management Care Management Coordinator Direct Line [phone] Toll Free: [phone]  Fax: [phone]

## 2017-10-22 ENCOUNTER — Other Ambulatory Visit: Payer: Self-pay

## 2017-10-22 NOTE — Patient Outreach (Addendum)
[facility]) Care Management  10/22/2017  Levi Turner 1950/07/20 [id number]   Referral Date:10/21/17 Referral Source: HTA Referral Reason: Requesting assistance with medication Xiidra 5%.   Outreach Attempt #2 Telephone call to patient. No answer.  HIPAA compliant voice message left.   Plan: RN CM will attempt patient again within 4 business days.    [name], RN, MSN [place] [facility] Care Management Coordinator Direct Line [phone] Cell [phone] Toll Free: [phone]  Fax: [phone]

## 2017-10-23 ENCOUNTER — Other Ambulatory Visit: Payer: Self-pay

## 2017-10-23 NOTE — Patient Outreach (Signed)
[facility]) Care Management  10/23/2017  Levi Turner [birth year] [id number]   Referral Date:10/21/17 Referral Source:HTA Referral Reason:Requesting assistance with medication Xiidra 5%.   Outreach Attempt #3 Telephone call to patient. No answer. HIPAA compliant voice message left.   Plan: RN CM will wait return phone call.  If no return call will close case.    [name], RN, MSN [place] [facility] Care Management Coordinator Direct Line [phone] Cell [phone] Toll Free: [phone]  Fax: [phone]

## 2017-11-04 ENCOUNTER — Other Ambulatory Visit: Payer: Self-pay

## 2017-11-04 NOTE — Patient Outreach (Signed)
[facility]) Care Management  11/04/2017  Levi Turner 1950/08/23 [id number]   Multiple attempts to establish contact with patient without success. No response from letter mailed to patient.   Plan: RN CM will close case at this time.   [name], RN, MSN [place] [facility] Care Management Coordinator Direct Line [phone] Cell [phone] Toll Free: [phone]  Fax: [phone]

## 2017-12-17 DIAGNOSIS — K51219 Ulcerative (chronic) proctitis with unspecified complications: Secondary | ICD-10-CM | POA: Diagnosis not present

## 2018-01-13 DIAGNOSIS — Z23 Encounter for immunization: Secondary | ICD-10-CM | POA: Diagnosis not present

## 2018-01-28 DIAGNOSIS — I10 Essential (primary) hypertension: Secondary | ICD-10-CM | POA: Diagnosis not present

## 2018-01-28 DIAGNOSIS — E7800 Pure hypercholesterolemia, unspecified: Secondary | ICD-10-CM | POA: Diagnosis not present

## 2018-01-28 DIAGNOSIS — N529 Male erectile dysfunction, unspecified: Secondary | ICD-10-CM | POA: Diagnosis not present

## 2018-01-28 DIAGNOSIS — K51219 Ulcerative (chronic) proctitis with unspecified complications: Secondary | ICD-10-CM | POA: Diagnosis not present

## 2018-01-28 DIAGNOSIS — R319 Hematuria, unspecified: Secondary | ICD-10-CM | POA: Diagnosis not present

## 2018-01-28 DIAGNOSIS — H4089 Other specified glaucoma: Secondary | ICD-10-CM | POA: Diagnosis not present

## 2018-01-28 DIAGNOSIS — R897 Abnormal histological findings in specimens from other organs, systems and tissues: Secondary | ICD-10-CM | POA: Diagnosis not present

## 2018-02-02 DIAGNOSIS — I10 Essential (primary) hypertension: Secondary | ICD-10-CM | POA: Diagnosis not present

## 2018-02-02 DIAGNOSIS — R319 Hematuria, unspecified: Secondary | ICD-10-CM | POA: Diagnosis not present

## 2018-02-02 DIAGNOSIS — R897 Abnormal histological findings in specimens from other organs, systems and tissues: Secondary | ICD-10-CM | POA: Diagnosis not present

## 2018-02-02 DIAGNOSIS — H4089 Other specified glaucoma: Secondary | ICD-10-CM | POA: Diagnosis not present

## 2018-02-02 DIAGNOSIS — E7800 Pure hypercholesterolemia, unspecified: Secondary | ICD-10-CM | POA: Diagnosis not present

## 2018-02-02 DIAGNOSIS — K51219 Ulcerative (chronic) proctitis with unspecified complications: Secondary | ICD-10-CM | POA: Diagnosis not present

## 2018-03-22 DIAGNOSIS — K51919 Ulcerative colitis, unspecified with unspecified complications: Secondary | ICD-10-CM | POA: Diagnosis not present

## 2018-04-02 DIAGNOSIS — L7211 Pilar cyst: Secondary | ICD-10-CM | POA: Diagnosis not present

## 2018-04-02 DIAGNOSIS — L820 Inflamed seborrheic keratosis: Secondary | ICD-10-CM | POA: Diagnosis not present

## 2018-04-14 DIAGNOSIS — K5190 Ulcerative colitis, unspecified, without complications: Secondary | ICD-10-CM | POA: Diagnosis not present

## 2018-04-14 DIAGNOSIS — K5140 Inflammatory polyps of colon without complications: Secondary | ICD-10-CM | POA: Diagnosis not present

## 2018-04-20 DIAGNOSIS — N4232 Atypical small acinar proliferation of prostate: Secondary | ICD-10-CM | POA: Diagnosis not present

## 2018-04-20 DIAGNOSIS — N400 Enlarged prostate without lower urinary tract symptoms: Secondary | ICD-10-CM | POA: Diagnosis not present

## 2018-04-20 DIAGNOSIS — K5140 Inflammatory polyps of colon without complications: Secondary | ICD-10-CM | POA: Diagnosis not present

## 2018-04-20 DIAGNOSIS — R972 Elevated prostate specific antigen [PSA]: Secondary | ICD-10-CM | POA: Diagnosis not present

## 2018-04-20 DIAGNOSIS — K5190 Ulcerative colitis, unspecified, without complications: Secondary | ICD-10-CM | POA: Diagnosis not present

## 2018-05-13 DIAGNOSIS — D125 Benign neoplasm of sigmoid colon: Secondary | ICD-10-CM | POA: Diagnosis not present

## 2018-05-13 DIAGNOSIS — K51919 Ulcerative colitis, unspecified with unspecified complications: Secondary | ICD-10-CM | POA: Diagnosis not present

## 2018-05-26 DIAGNOSIS — D128 Benign neoplasm of rectum: Secondary | ICD-10-CM | POA: Diagnosis not present

## 2018-05-26 DIAGNOSIS — K5140 Inflammatory polyps of colon without complications: Secondary | ICD-10-CM | POA: Diagnosis not present

## 2018-05-28 DIAGNOSIS — K5140 Inflammatory polyps of colon without complications: Secondary | ICD-10-CM | POA: Diagnosis not present

## 2018-05-28 DIAGNOSIS — D128 Benign neoplasm of rectum: Secondary | ICD-10-CM | POA: Diagnosis not present

## 2018-06-10 DIAGNOSIS — Z86010 Personal history of colonic polyps: Secondary | ICD-10-CM | POA: Diagnosis not present

## 2018-06-10 DIAGNOSIS — K51919 Ulcerative colitis, unspecified with unspecified complications: Secondary | ICD-10-CM | POA: Diagnosis not present

## 2018-09-22 DIAGNOSIS — H40033 Anatomical narrow angle, bilateral: Secondary | ICD-10-CM | POA: Diagnosis not present

## 2018-09-22 DIAGNOSIS — H04123 Dry eye syndrome of bilateral lacrimal glands: Secondary | ICD-10-CM | POA: Diagnosis not present

## 2018-10-19 DIAGNOSIS — R972 Elevated prostate specific antigen [PSA]: Secondary | ICD-10-CM | POA: Diagnosis not present

## 2018-10-26 DIAGNOSIS — R972 Elevated prostate specific antigen [PSA]: Secondary | ICD-10-CM | POA: Diagnosis not present

## 2018-10-26 DIAGNOSIS — N400 Enlarged prostate without lower urinary tract symptoms: Secondary | ICD-10-CM | POA: Diagnosis not present

## 2019-02-09 DIAGNOSIS — K51919 Ulcerative colitis, unspecified with unspecified complications: Secondary | ICD-10-CM | POA: Diagnosis not present

## 2019-02-09 DIAGNOSIS — Z86010 Personal history of colonic polyps: Secondary | ICD-10-CM | POA: Diagnosis not present

## 2019-03-30 DIAGNOSIS — Z20820 Contact with and (suspected) exposure to varicella: Secondary | ICD-10-CM | POA: Diagnosis not present

## 2019-03-30 DIAGNOSIS — K51919 Ulcerative colitis, unspecified with unspecified complications: Secondary | ICD-10-CM | POA: Diagnosis not present

## 2019-03-30 DIAGNOSIS — I10 Essential (primary) hypertension: Secondary | ICD-10-CM | POA: Diagnosis not present

## 2019-03-30 DIAGNOSIS — Z86010 Personal history of colonic polyps: Secondary | ICD-10-CM | POA: Diagnosis not present

## 2019-03-30 DIAGNOSIS — H4089 Other specified glaucoma: Secondary | ICD-10-CM | POA: Diagnosis not present

## 2019-03-30 DIAGNOSIS — R897 Abnormal histological findings in specimens from other organs, systems and tissues: Secondary | ICD-10-CM | POA: Diagnosis not present

## 2019-03-30 DIAGNOSIS — E7800 Pure hypercholesterolemia, unspecified: Secondary | ICD-10-CM | POA: Diagnosis not present

## 2019-04-21 DIAGNOSIS — R972 Elevated prostate specific antigen [PSA]: Secondary | ICD-10-CM | POA: Diagnosis not present

## 2019-07-21 DIAGNOSIS — E7800 Pure hypercholesterolemia, unspecified: Secondary | ICD-10-CM | POA: Diagnosis not present

## 2019-07-21 DIAGNOSIS — N400 Enlarged prostate without lower urinary tract symptoms: Secondary | ICD-10-CM | POA: Diagnosis not present

## 2019-07-21 DIAGNOSIS — I10 Essential (primary) hypertension: Secondary | ICD-10-CM | POA: Diagnosis not present

## 2019-07-21 DIAGNOSIS — H4089 Other specified glaucoma: Secondary | ICD-10-CM | POA: Diagnosis not present

## 2019-08-24 DIAGNOSIS — E7800 Pure hypercholesterolemia, unspecified: Secondary | ICD-10-CM | POA: Diagnosis not present

## 2019-08-24 DIAGNOSIS — N400 Enlarged prostate without lower urinary tract symptoms: Secondary | ICD-10-CM | POA: Diagnosis not present

## 2019-08-24 DIAGNOSIS — H4089 Other specified glaucoma: Secondary | ICD-10-CM | POA: Diagnosis not present

## 2019-08-24 DIAGNOSIS — I10 Essential (primary) hypertension: Secondary | ICD-10-CM | POA: Diagnosis not present

## 2019-10-05 DIAGNOSIS — N400 Enlarged prostate without lower urinary tract symptoms: Secondary | ICD-10-CM | POA: Diagnosis not present

## 2019-10-05 DIAGNOSIS — E7800 Pure hypercholesterolemia, unspecified: Secondary | ICD-10-CM | POA: Diagnosis not present

## 2019-10-05 DIAGNOSIS — I10 Essential (primary) hypertension: Secondary | ICD-10-CM | POA: Diagnosis not present

## 2019-10-05 DIAGNOSIS — H4089 Other specified glaucoma: Secondary | ICD-10-CM | POA: Diagnosis not present

## 2019-10-06 DIAGNOSIS — H4089 Other specified glaucoma: Secondary | ICD-10-CM | POA: Diagnosis not present

## 2019-10-06 DIAGNOSIS — N400 Enlarged prostate without lower urinary tract symptoms: Secondary | ICD-10-CM | POA: Diagnosis not present

## 2019-10-06 DIAGNOSIS — I10 Essential (primary) hypertension: Secondary | ICD-10-CM | POA: Diagnosis not present

## 2019-10-06 DIAGNOSIS — E7800 Pure hypercholesterolemia, unspecified: Secondary | ICD-10-CM | POA: Diagnosis not present

## 2019-10-17 DIAGNOSIS — K51919 Ulcerative colitis, unspecified with unspecified complications: Secondary | ICD-10-CM | POA: Diagnosis not present

## 2019-10-18 DIAGNOSIS — R972 Elevated prostate specific antigen [PSA]: Secondary | ICD-10-CM | POA: Diagnosis not present

## 2019-10-26 DIAGNOSIS — N401 Enlarged prostate with lower urinary tract symptoms: Secondary | ICD-10-CM | POA: Diagnosis not present

## 2019-10-26 DIAGNOSIS — R972 Elevated prostate specific antigen [PSA]: Secondary | ICD-10-CM | POA: Diagnosis not present

## 2019-10-26 DIAGNOSIS — R3912 Poor urinary stream: Secondary | ICD-10-CM | POA: Diagnosis not present

## 2020-01-10 DIAGNOSIS — K51919 Ulcerative colitis, unspecified with unspecified complications: Secondary | ICD-10-CM | POA: Diagnosis not present

## 2020-01-20 DIAGNOSIS — I10 Essential (primary) hypertension: Secondary | ICD-10-CM | POA: Diagnosis not present

## 2020-01-20 DIAGNOSIS — N400 Enlarged prostate without lower urinary tract symptoms: Secondary | ICD-10-CM | POA: Diagnosis not present

## 2020-01-20 DIAGNOSIS — H4089 Other specified glaucoma: Secondary | ICD-10-CM | POA: Diagnosis not present

## 2020-01-20 DIAGNOSIS — E7800 Pure hypercholesterolemia, unspecified: Secondary | ICD-10-CM | POA: Diagnosis not present

## 2020-03-05 DIAGNOSIS — K625 Hemorrhage of anus and rectum: Secondary | ICD-10-CM | POA: Diagnosis not present

## 2020-03-05 DIAGNOSIS — K51919 Ulcerative colitis, unspecified with unspecified complications: Secondary | ICD-10-CM | POA: Diagnosis not present

## 2020-03-05 DIAGNOSIS — R197 Diarrhea, unspecified: Secondary | ICD-10-CM | POA: Diagnosis not present

## 2020-03-07 DIAGNOSIS — I10 Essential (primary) hypertension: Secondary | ICD-10-CM | POA: Diagnosis not present

## 2020-03-08 DIAGNOSIS — Z1159 Encounter for screening for other viral diseases: Secondary | ICD-10-CM | POA: Diagnosis not present

## 2020-03-09 DIAGNOSIS — R197 Diarrhea, unspecified: Secondary | ICD-10-CM | POA: Diagnosis not present

## 2020-03-09 DIAGNOSIS — D120 Benign neoplasm of cecum: Secondary | ICD-10-CM | POA: Diagnosis not present

## 2020-03-09 DIAGNOSIS — K51411 Inflammatory polyps of colon with rectal bleeding: Secondary | ICD-10-CM | POA: Diagnosis not present

## 2020-03-09 DIAGNOSIS — K5289 Other specified noninfective gastroenteritis and colitis: Secondary | ICD-10-CM | POA: Diagnosis not present

## 2020-03-09 DIAGNOSIS — K625 Hemorrhage of anus and rectum: Secondary | ICD-10-CM | POA: Diagnosis not present

## 2020-03-09 DIAGNOSIS — K51511 Left sided colitis with rectal bleeding: Secondary | ICD-10-CM | POA: Diagnosis not present

## 2020-03-13 DIAGNOSIS — E7800 Pure hypercholesterolemia, unspecified: Secondary | ICD-10-CM | POA: Diagnosis not present

## 2020-03-13 DIAGNOSIS — N400 Enlarged prostate without lower urinary tract symptoms: Secondary | ICD-10-CM | POA: Diagnosis not present

## 2020-03-13 DIAGNOSIS — I10 Essential (primary) hypertension: Secondary | ICD-10-CM | POA: Diagnosis not present

## 2020-03-13 DIAGNOSIS — H4089 Other specified glaucoma: Secondary | ICD-10-CM | POA: Diagnosis not present

## 2020-03-14 DIAGNOSIS — K5289 Other specified noninfective gastroenteritis and colitis: Secondary | ICD-10-CM | POA: Diagnosis not present

## 2020-03-14 DIAGNOSIS — D120 Benign neoplasm of cecum: Secondary | ICD-10-CM | POA: Diagnosis not present

## 2020-04-05 DIAGNOSIS — Z87898 Personal history of other specified conditions: Secondary | ICD-10-CM | POA: Diagnosis not present

## 2020-04-05 DIAGNOSIS — Z7952 Long term (current) use of systemic steroids: Secondary | ICD-10-CM | POA: Diagnosis not present

## 2020-04-05 DIAGNOSIS — N400 Enlarged prostate without lower urinary tract symptoms: Secondary | ICD-10-CM | POA: Diagnosis not present

## 2020-04-05 DIAGNOSIS — Z86010 Personal history of colonic polyps: Secondary | ICD-10-CM | POA: Diagnosis not present

## 2020-04-05 DIAGNOSIS — R319 Hematuria, unspecified: Secondary | ICD-10-CM | POA: Diagnosis not present

## 2020-04-05 DIAGNOSIS — N529 Male erectile dysfunction, unspecified: Secondary | ICD-10-CM | POA: Diagnosis not present

## 2020-04-05 DIAGNOSIS — Z23 Encounter for immunization: Secondary | ICD-10-CM | POA: Diagnosis not present

## 2020-04-05 DIAGNOSIS — I10 Essential (primary) hypertension: Secondary | ICD-10-CM | POA: Diagnosis not present

## 2020-04-05 DIAGNOSIS — E7800 Pure hypercholesterolemia, unspecified: Secondary | ICD-10-CM | POA: Diagnosis not present

## 2020-04-05 DIAGNOSIS — K51919 Ulcerative colitis, unspecified with unspecified complications: Secondary | ICD-10-CM | POA: Diagnosis not present

## 2020-05-03 DIAGNOSIS — K5100 Ulcerative (chronic) pancolitis without complications: Secondary | ICD-10-CM | POA: Diagnosis not present

## 2020-05-10 DIAGNOSIS — E7800 Pure hypercholesterolemia, unspecified: Secondary | ICD-10-CM | POA: Diagnosis not present

## 2020-05-10 DIAGNOSIS — N400 Enlarged prostate without lower urinary tract symptoms: Secondary | ICD-10-CM | POA: Diagnosis not present

## 2020-05-10 DIAGNOSIS — H4089 Other specified glaucoma: Secondary | ICD-10-CM | POA: Diagnosis not present

## 2020-05-10 DIAGNOSIS — I10 Essential (primary) hypertension: Secondary | ICD-10-CM | POA: Diagnosis not present

## 2020-05-21 DIAGNOSIS — K5100 Ulcerative (chronic) pancolitis without complications: Secondary | ICD-10-CM | POA: Diagnosis not present

## 2020-06-06 DIAGNOSIS — N400 Enlarged prostate without lower urinary tract symptoms: Secondary | ICD-10-CM | POA: Diagnosis not present

## 2020-06-06 DIAGNOSIS — I10 Essential (primary) hypertension: Secondary | ICD-10-CM | POA: Diagnosis not present

## 2020-06-06 DIAGNOSIS — H4089 Other specified glaucoma: Secondary | ICD-10-CM | POA: Diagnosis not present

## 2020-06-06 DIAGNOSIS — E7800 Pure hypercholesterolemia, unspecified: Secondary | ICD-10-CM | POA: Diagnosis not present

## 2020-07-03 DIAGNOSIS — K5100 Ulcerative (chronic) pancolitis without complications: Secondary | ICD-10-CM | POA: Diagnosis not present

## 2020-07-20 DIAGNOSIS — I10 Essential (primary) hypertension: Secondary | ICD-10-CM | POA: Diagnosis not present

## 2020-07-20 DIAGNOSIS — H4089 Other specified glaucoma: Secondary | ICD-10-CM | POA: Diagnosis not present

## 2020-07-20 DIAGNOSIS — E7800 Pure hypercholesterolemia, unspecified: Secondary | ICD-10-CM | POA: Diagnosis not present

## 2020-07-20 DIAGNOSIS — N400 Enlarged prostate without lower urinary tract symptoms: Secondary | ICD-10-CM | POA: Diagnosis not present

## 2020-08-29 DIAGNOSIS — Z0000 Encounter for general adult medical examination without abnormal findings: Secondary | ICD-10-CM | POA: Diagnosis not present

## 2020-09-05 DIAGNOSIS — Z125 Encounter for screening for malignant neoplasm of prostate: Secondary | ICD-10-CM | POA: Diagnosis not present

## 2020-09-05 DIAGNOSIS — N529 Male erectile dysfunction, unspecified: Secondary | ICD-10-CM | POA: Diagnosis not present

## 2020-09-05 DIAGNOSIS — Z87898 Personal history of other specified conditions: Secondary | ICD-10-CM | POA: Diagnosis not present

## 2020-09-05 DIAGNOSIS — Z0000 Encounter for general adult medical examination without abnormal findings: Secondary | ICD-10-CM | POA: Diagnosis not present

## 2020-09-05 DIAGNOSIS — I10 Essential (primary) hypertension: Secondary | ICD-10-CM | POA: Diagnosis not present

## 2020-09-05 DIAGNOSIS — D649 Anemia, unspecified: Secondary | ICD-10-CM | POA: Diagnosis not present

## 2020-09-05 DIAGNOSIS — N400 Enlarged prostate without lower urinary tract symptoms: Secondary | ICD-10-CM | POA: Diagnosis not present

## 2020-09-05 DIAGNOSIS — Z23 Encounter for immunization: Secondary | ICD-10-CM | POA: Diagnosis not present

## 2020-09-05 DIAGNOSIS — E7800 Pure hypercholesterolemia, unspecified: Secondary | ICD-10-CM | POA: Diagnosis not present

## 2020-09-10 DIAGNOSIS — E7800 Pure hypercholesterolemia, unspecified: Secondary | ICD-10-CM | POA: Diagnosis not present

## 2020-09-10 DIAGNOSIS — H4089 Other specified glaucoma: Secondary | ICD-10-CM | POA: Diagnosis not present

## 2020-09-10 DIAGNOSIS — N400 Enlarged prostate without lower urinary tract symptoms: Secondary | ICD-10-CM | POA: Diagnosis not present

## 2020-09-10 DIAGNOSIS — I10 Essential (primary) hypertension: Secondary | ICD-10-CM | POA: Diagnosis not present

## 2020-09-24 DIAGNOSIS — K5100 Ulcerative (chronic) pancolitis without complications: Secondary | ICD-10-CM | POA: Diagnosis not present

## 2020-10-03 DIAGNOSIS — K5100 Ulcerative (chronic) pancolitis without complications: Secondary | ICD-10-CM | POA: Diagnosis not present

## 2020-11-07 DIAGNOSIS — I10 Essential (primary) hypertension: Secondary | ICD-10-CM | POA: Diagnosis not present

## 2020-11-07 DIAGNOSIS — H4089 Other specified glaucoma: Secondary | ICD-10-CM | POA: Diagnosis not present

## 2020-11-07 DIAGNOSIS — E7800 Pure hypercholesterolemia, unspecified: Secondary | ICD-10-CM | POA: Diagnosis not present

## 2020-11-07 DIAGNOSIS — N400 Enlarged prostate without lower urinary tract symptoms: Secondary | ICD-10-CM | POA: Diagnosis not present

## 2021-01-18 DIAGNOSIS — E7800 Pure hypercholesterolemia, unspecified: Secondary | ICD-10-CM | POA: Diagnosis not present

## 2021-01-18 DIAGNOSIS — N400 Enlarged prostate without lower urinary tract symptoms: Secondary | ICD-10-CM | POA: Diagnosis not present

## 2021-01-18 DIAGNOSIS — H4089 Other specified glaucoma: Secondary | ICD-10-CM | POA: Diagnosis not present

## 2021-01-18 DIAGNOSIS — I10 Essential (primary) hypertension: Secondary | ICD-10-CM | POA: Diagnosis not present

## 2021-03-14 DIAGNOSIS — E7800 Pure hypercholesterolemia, unspecified: Secondary | ICD-10-CM | POA: Diagnosis not present

## 2021-03-14 DIAGNOSIS — I10 Essential (primary) hypertension: Secondary | ICD-10-CM | POA: Diagnosis not present

## 2021-03-14 DIAGNOSIS — N400 Enlarged prostate without lower urinary tract symptoms: Secondary | ICD-10-CM | POA: Diagnosis not present

## 2021-03-14 DIAGNOSIS — H4089 Other specified glaucoma: Secondary | ICD-10-CM | POA: Diagnosis not present

## 2021-03-18 DIAGNOSIS — I10 Essential (primary) hypertension: Secondary | ICD-10-CM | POA: Diagnosis not present

## 2021-05-01 ENCOUNTER — Other Ambulatory Visit (HOSPITAL_COMMUNITY): Payer: Self-pay | Admitting: Orthopedic Surgery

## 2021-05-01 ENCOUNTER — Other Ambulatory Visit: Payer: Self-pay | Admitting: Orthopedic Surgery

## 2021-05-01 DIAGNOSIS — M25511 Pain in right shoulder: Secondary | ICD-10-CM | POA: Diagnosis not present

## 2021-05-01 DIAGNOSIS — Z96611 Presence of right artificial shoulder joint: Secondary | ICD-10-CM | POA: Diagnosis not present

## 2021-05-06 ENCOUNTER — Other Ambulatory Visit: Payer: Self-pay | Admitting: Orthopedic Surgery

## 2021-05-06 DIAGNOSIS — Z96611 Presence of right artificial shoulder joint: Secondary | ICD-10-CM

## 2021-05-09 ENCOUNTER — Other Ambulatory Visit: Payer: Self-pay

## 2021-05-09 ENCOUNTER — Encounter (HOSPITAL_COMMUNITY)
Admission: RE | Admit: 2021-05-09 | Discharge: 2021-05-09 | Disposition: A | Discharge status: Home / Self Care | Payer: PPO | Source: Ambulatory Visit | Attending: Orthopedic Surgery | Admitting: Orthopedic Surgery

## 2021-05-09 DIAGNOSIS — Z96611 Presence of right artificial shoulder joint: Secondary | ICD-10-CM | POA: Diagnosis not present

## 2021-05-09 DIAGNOSIS — M25511 Pain in right shoulder: Secondary | ICD-10-CM | POA: Diagnosis not present

## 2021-05-09 MED ORDER — TECHNETIUM TC 99M MEDRONATE IV KIT
20.0000 | PACK | Freq: Once | INTRAVENOUS | Status: AC | PRN
  Administered 2021-05-09: 20 via INTRAVENOUS

## 2021-05-14 DIAGNOSIS — Z96611 Presence of right artificial shoulder joint: Secondary | ICD-10-CM | POA: Diagnosis not present

## 2021-05-15 ENCOUNTER — Other Ambulatory Visit: Payer: Self-pay

## 2021-05-15 ENCOUNTER — Encounter (HOSPITAL_COMMUNITY): Payer: Self-pay | Admitting: Orthopedic Surgery

## 2021-05-15 DIAGNOSIS — T8459XD Infection and inflammatory reaction due to other internal joint prosthesis, subsequent encounter: Secondary | ICD-10-CM | POA: Diagnosis not present

## 2021-05-15 DIAGNOSIS — Z96611 Presence of right artificial shoulder joint: Secondary | ICD-10-CM | POA: Diagnosis not present

## 2021-05-15 NOTE — Progress Notes (Signed)
For Short Stay: COVID SWAB appointment date: arriving at 1345 to get COVID testing in short stay Date of COVID positive in last 90 days: N/A  Bowel Prep reminder: N/A  For Anesthesia: PCP - Kristen Loader, FNP Cardiologist - Candee Furbish, MD  Gastroenterologist-Dr. Wilford Corner  Chest x-ray - greater than 1 years EKG - greater than 1 years Stress Test - greater than 2years ECHO - greater than 2 years Cardiac Cath - greater than 2years Pacemaker/ICD device last checked: N/A Pacemaker orders received: N/A Device Rep notified: N/A  Spinal Cord Stimulator: N/A  Sleep Study - N/A CPAP - N/A  Fasting Blood Sugar - N/A Checks Blood Sugar _N/A____ times a day Date and result of last Hgb A1c-N/A  Blood Thinner Instructions: N/A Aspirin Instructions:N/A Last Dose:N/A  Activity level: Able to exercise without chest pain and/or shortness of breath       Anesthesia review: N/A  Patient denies shortness of breath, fever, cough and chest pain at PAT appointment   Patient verbalized understanding of instructions that were given to them at the PAT appointment. Patient was also instructed that they will need to review over the PAT instructions again at home before surgery.

## 2021-05-16 ENCOUNTER — Other Ambulatory Visit: Payer: PPO

## 2021-05-16 ENCOUNTER — Other Ambulatory Visit: Payer: Self-pay

## 2021-05-16 ENCOUNTER — Inpatient Hospital Stay: Payer: Self-pay

## 2021-05-16 ENCOUNTER — Encounter (HOSPITAL_COMMUNITY): Payer: Self-pay | Admitting: Orthopedic Surgery

## 2021-05-16 ENCOUNTER — Inpatient Hospital Stay (HOSPITAL_COMMUNITY)
Admission: RE | Admit: 2021-05-16 | Discharge: 2021-05-20 | DRG: 496 | Disposition: A | Discharge status: Home Health | Payer: PPO | Source: Ambulatory Visit | Attending: Orthopedic Surgery | Admitting: Orthopedic Surgery

## 2021-05-16 ENCOUNTER — Inpatient Hospital Stay (HOSPITAL_COMMUNITY): Payer: PPO

## 2021-05-16 ENCOUNTER — Inpatient Hospital Stay (HOSPITAL_COMMUNITY): Payer: PPO | Admitting: Anesthesiology

## 2021-05-16 ENCOUNTER — Encounter (HOSPITAL_COMMUNITY)
Admission: RE | Disposition: A | Discharge status: Home Health | Payer: Self-pay | Source: Ambulatory Visit | Attending: Orthopedic Surgery

## 2021-05-16 DIAGNOSIS — Z79620 Long term (current) use of immunosuppressive biologic: Secondary | ICD-10-CM | POA: Diagnosis not present

## 2021-05-16 DIAGNOSIS — Z96611 Presence of right artificial shoulder joint: Secondary | ICD-10-CM | POA: Diagnosis present

## 2021-05-16 DIAGNOSIS — Z20822 Contact with and (suspected) exposure to covid-19: Secondary | ICD-10-CM | POA: Diagnosis present

## 2021-05-16 DIAGNOSIS — Z4731 Aftercare following explantation of shoulder joint prosthesis: Secondary | ICD-10-CM | POA: Diagnosis present

## 2021-05-16 DIAGNOSIS — Y792 Prosthetic and other implants, materials and accessory orthopedic devices associated with adverse incidents: Secondary | ICD-10-CM | POA: Diagnosis not present

## 2021-05-16 DIAGNOSIS — T8459XA Infection and inflammatory reaction due to other internal joint prosthesis, initial encounter: Principal | ICD-10-CM | POA: Diagnosis present

## 2021-05-16 DIAGNOSIS — K5190 Ulcerative colitis, unspecified, without complications: Secondary | ICD-10-CM | POA: Diagnosis not present

## 2021-05-16 DIAGNOSIS — Z79891 Long term (current) use of opiate analgesic: Secondary | ICD-10-CM | POA: Diagnosis not present

## 2021-05-16 DIAGNOSIS — Z96651 Presence of right artificial knee joint: Secondary | ICD-10-CM | POA: Diagnosis present

## 2021-05-16 DIAGNOSIS — M19011 Primary osteoarthritis, right shoulder: Secondary | ICD-10-CM | POA: Diagnosis not present

## 2021-05-16 DIAGNOSIS — I10 Essential (primary) hypertension: Secondary | ICD-10-CM | POA: Diagnosis present

## 2021-05-16 DIAGNOSIS — S42301A Unspecified fracture of shaft of humerus, right arm, initial encounter for closed fracture: Secondary | ICD-10-CM | POA: Diagnosis not present

## 2021-05-16 DIAGNOSIS — G8918 Other acute postprocedural pain: Secondary | ICD-10-CM | POA: Diagnosis not present

## 2021-05-16 DIAGNOSIS — S42391B Other fracture of shaft of right humerus, initial encounter for open fracture: Secondary | ICD-10-CM | POA: Diagnosis not present

## 2021-05-16 DIAGNOSIS — Z79899 Other long term (current) drug therapy: Secondary | ICD-10-CM | POA: Diagnosis not present

## 2021-05-16 DIAGNOSIS — F32A Depression, unspecified: Secondary | ICD-10-CM | POA: Diagnosis not present

## 2021-05-16 DIAGNOSIS — I2510 Atherosclerotic heart disease of native coronary artery without angina pectoris: Secondary | ICD-10-CM

## 2021-05-16 DIAGNOSIS — Z9889 Other specified postprocedural states: Secondary | ICD-10-CM | POA: Diagnosis not present

## 2021-05-16 DIAGNOSIS — Z01818 Encounter for other preprocedural examination: Secondary | ICD-10-CM

## 2021-05-16 DIAGNOSIS — T8459XD Infection and inflammatory reaction due to other internal joint prosthesis, subsequent encounter: Secondary | ICD-10-CM | POA: Diagnosis not present

## 2021-05-16 DIAGNOSIS — Z471 Aftercare following joint replacement surgery: Secondary | ICD-10-CM | POA: Diagnosis not present

## 2021-05-16 DIAGNOSIS — E785 Hyperlipidemia, unspecified: Secondary | ICD-10-CM | POA: Diagnosis present

## 2021-05-16 DIAGNOSIS — M9689 Other intraoperative and postprocedural complications and disorders of the musculoskeletal system: Secondary | ICD-10-CM | POA: Diagnosis not present

## 2021-05-16 DIAGNOSIS — Z87891 Personal history of nicotine dependence: Secondary | ICD-10-CM | POA: Diagnosis not present

## 2021-05-16 DIAGNOSIS — S42309A Unspecified fracture of shaft of humerus, unspecified arm, initial encounter for closed fracture: Secondary | ICD-10-CM

## 2021-05-16 DIAGNOSIS — M12811 Other specific arthropathies, not elsewhere classified, right shoulder: Secondary | ICD-10-CM

## 2021-05-16 DIAGNOSIS — I959 Hypotension, unspecified: Secondary | ICD-10-CM | POA: Diagnosis not present

## 2021-05-16 DIAGNOSIS — D62 Acute posthemorrhagic anemia: Secondary | ICD-10-CM | POA: Diagnosis not present

## 2021-05-16 DIAGNOSIS — M75101 Unspecified rotator cuff tear or rupture of right shoulder, not specified as traumatic: Secondary | ICD-10-CM

## 2021-05-16 HISTORY — PX: TOTAL SHOULDER REVISION: SHX6130

## 2021-05-16 LAB — BASIC METABOLIC PANEL
Anion gap: 9 (ref 5–15)
BUN: 11 mg/dL (ref 8–23)
CO2: 26 mmol/L (ref 22–32)
Calcium: 9.8 mg/dL (ref 8.9–10.3)
Chloride: 99 mmol/L (ref 98–111)
Creatinine, Ser: 0.86 mg/dL (ref 0.61–1.24)
GFR, Estimated: >60 mL/min (ref >60)
Glucose, Bld: 109 mg/dL — ABNORMAL HIGH (ref 70–99)
Potassium: 4.3 mmol/L (ref 3.5–5.1)
Sodium: 134 mmol/L — ABNORMAL LOW (ref 135–145)

## 2021-05-16 LAB — SARS CORONAVIRUS 2 BY RT PCR (HOSPITAL ORDER, PERFORMED IN ~~LOC~~ HOSPITAL LAB): SARS Coronavirus 2: NEGATIVE

## 2021-05-16 LAB — CBC
HCT: 35 % — ABNORMAL LOW (ref 39.0–52.0)
Hemoglobin: 11 g/dL — ABNORMAL LOW (ref 13.0–17.0)
MCH: 28 pg (ref 26.0–34.0)
MCHC: 31.4 g/dL (ref 30.0–36.0)
MCV: 89.1 fL (ref 80.0–100.0)
Platelets: 564 10*3/uL — ABNORMAL HIGH (ref 150–400)
RBC: 3.93 MIL/uL — ABNORMAL LOW (ref 4.22–5.81)
RDW: 15.1 % (ref 11.5–15.5)
WBC: 10.1 10*3/uL (ref 4.0–10.5)
nRBC: 0 % (ref 0.0–0.2)

## 2021-05-16 LAB — SURGICAL PCR SCREEN
MRSA, PCR: NEGATIVE
Staphylococcus aureus: NEGATIVE

## 2021-05-16 SURGERY — REVISION, TOTAL ARTHROPLASTY, SHOULDER
Anesthesia: General | Site: Shoulder | Laterality: Right

## 2021-05-16 MED ORDER — LACTATED RINGERS IV SOLN: INTRAVENOUS | Status: DC

## 2021-05-16 MED ORDER — PROPOFOL 10 MG/ML IV BOLUS
INTRAVENOUS | Status: AC
  Filled 2021-05-16: qty 20

## 2021-05-16 MED ORDER — VANCOMYCIN HCL 1000 MG IV SOLR
INTRAVENOUS | Status: AC
  Filled 2021-05-16: qty 20

## 2021-05-16 MED ORDER — PHENYLEPHRINE HCL (PRESSORS) 10 MG/ML IV SOLN
INTRAVENOUS | Status: AC
  Filled 2021-05-16: qty 1

## 2021-05-16 MED ORDER — METHOCARBAMOL 500 MG PO TABS
500.0000 mg | ORAL_TABLET | Freq: Four times a day (QID) | ORAL | Status: DC | PRN
  Administered 2021-05-17 – 2021-05-20 (×8): 500 mg via ORAL
  Filled 2021-05-16 (×8): qty 1

## 2021-05-16 MED ORDER — IRBESARTAN 150 MG PO TABS
300.0000 mg | ORAL_TABLET | Freq: Every day | ORAL | Status: DC
  Administered 2021-05-17 – 2021-05-20 (×4): 300 mg via ORAL
  Filled 2021-05-16 (×4): qty 2

## 2021-05-16 MED ORDER — OXYCODONE HCL 5 MG PO TABS
10.0000 mg | ORAL_TABLET | ORAL | Status: DC | PRN
  Administered 2021-05-17 – 2021-05-20 (×9): 10 mg via ORAL
  Filled 2021-05-16 (×8): qty 2

## 2021-05-16 MED ORDER — FENTANYL CITRATE (PF) 100 MCG/2ML IJ SOLN
INTRAMUSCULAR | Status: DC | PRN
Start: 2021-05-16 — End: 2021-05-16
  Administered 2021-05-16: 50 ug via INTRAVENOUS
  Administered 2021-05-16: 25 ug via INTRAVENOUS

## 2021-05-16 MED ORDER — OXYCODONE HCL 5 MG PO TABS
5.0000 mg | ORAL_TABLET | ORAL | Status: DC | PRN
  Administered 2021-05-17 – 2021-05-19 (×3): 5 mg via ORAL
  Filled 2021-05-16 (×5): qty 1

## 2021-05-16 MED ORDER — ROCURONIUM BROMIDE 10 MG/ML (PF) SYRINGE
PREFILLED_SYRINGE | INTRAVENOUS | Status: AC
  Filled 2021-05-16: qty 10

## 2021-05-16 MED ORDER — 0.9 % SODIUM CHLORIDE (POUR BTL) OPTIME
TOPICAL | Status: DC | PRN
  Administered 2021-05-16: 1000 mL

## 2021-05-16 MED ORDER — METOCLOPRAMIDE HCL 5 MG PO TABS: 5.0000 mg | ORAL_TABLET | Freq: Three times a day (TID) | ORAL | Status: DC | PRN

## 2021-05-16 MED ORDER — DIPHENHYDRAMINE HCL 12.5 MG/5ML PO ELIX: 12.5000 mg | ORAL_SOLUTION | ORAL | Status: DC | PRN

## 2021-05-16 MED ORDER — METHOCARBAMOL 1000 MG/10ML IJ SOLN
500.0000 mg | Freq: Four times a day (QID) | INTRAVENOUS | Status: DC | PRN
  Filled 2021-05-16: qty 5

## 2021-05-16 MED ORDER — PANTOPRAZOLE SODIUM 40 MG PO TBEC
40.0000 mg | DELAYED_RELEASE_TABLET | Freq: Every day | ORAL | Status: DC
  Administered 2021-05-17 – 2021-05-20 (×4): 40 mg via ORAL
  Filled 2021-05-16 (×4): qty 1

## 2021-05-16 MED ORDER — VANCOMYCIN HCL IN DEXTROSE 1-5 GM/200ML-% IV SOLN
INTRAVENOUS | Status: AC
  Filled 2021-05-16: qty 200

## 2021-05-16 MED ORDER — MIDAZOLAM HCL 2 MG/2ML IJ SOLN
1.0000 mg | INTRAMUSCULAR | Status: DC
  Filled 2021-05-16: qty 2

## 2021-05-16 MED ORDER — ACETAMINOPHEN 160 MG/5ML PO SOLN: 325.0000 mg | Freq: Once | ORAL | Status: DC | PRN

## 2021-05-16 MED ORDER — PHENOL 1.4 % MT LIQD: 1.0000 | OROMUCOSAL | Status: DC | PRN

## 2021-05-16 MED ORDER — HYDROMORPHONE HCL 1 MG/ML IJ SOLN
0.5000 mg | INTRAMUSCULAR | Status: DC | PRN
  Administered 2021-05-17 – 2021-05-19 (×3): 1 mg via INTRAVENOUS
  Filled 2021-05-16 (×3): qty 1

## 2021-05-16 MED ORDER — EPHEDRINE SULFATE-NACL 50-0.9 MG/10ML-% IV SOSY
PREFILLED_SYRINGE | INTRAVENOUS | Status: DC | PRN
Start: 2021-05-16 — End: 2021-05-16
  Administered 2021-05-16 (×3): 5 mg via INTRAVENOUS

## 2021-05-16 MED ORDER — BISACODYL 5 MG PO TBEC: 5.0000 mg | DELAYED_RELEASE_TABLET | Freq: Every day | ORAL | Status: DC | PRN

## 2021-05-16 MED ORDER — MEPERIDINE HCL 50 MG/ML IJ SOLN: 6.2500 mg | INTRAMUSCULAR | Status: DC | PRN

## 2021-05-16 MED ORDER — ACETAMINOPHEN 10 MG/ML IV SOLN: 1000.0000 mg | Freq: Once | INTRAVENOUS | Status: DC | PRN

## 2021-05-16 MED ORDER — ALBUMIN HUMAN 5 % IV SOLN: INTRAVENOUS | Status: DC | PRN

## 2021-05-16 MED ORDER — PHENYLEPHRINE 40 MCG/ML (10ML) SYRINGE FOR IV PUSH (FOR BLOOD PRESSURE SUPPORT)
PREFILLED_SYRINGE | INTRAVENOUS | Status: DC | PRN
  Administered 2021-05-16: 120 ug via INTRAVENOUS

## 2021-05-16 MED ORDER — SUCCINYLCHOLINE CHLORIDE 200 MG/10ML IV SOSY
PREFILLED_SYRINGE | INTRAVENOUS | Status: AC
  Filled 2021-05-16: qty 10

## 2021-05-16 MED ORDER — HYDROMORPHONE HCL 1 MG/ML IJ SOLN
INTRAMUSCULAR | Status: AC
  Filled 2021-05-16: qty 1

## 2021-05-16 MED ORDER — ORAL CARE MOUTH RINSE: 15.0000 mL | Freq: Once | OROMUCOSAL | Status: AC

## 2021-05-16 MED ORDER — MENTHOL 3 MG MT LOZG: 1.0000 | LOZENGE | OROMUCOSAL | Status: DC | PRN

## 2021-05-16 MED ORDER — VANCOMYCIN HCL 1750 MG/350ML IV SOLN
1750.0000 mg | INTRAVENOUS | Status: DC
  Administered 2021-05-17 – 2021-05-19 (×3): 1750 mg via INTRAVENOUS
  Filled 2021-05-16 (×4): qty 350

## 2021-05-16 MED ORDER — POLYETHYLENE GLYCOL 3350 17 G PO PACK: 17.0000 g | PACK | Freq: Every day | ORAL | Status: DC | PRN

## 2021-05-16 MED ORDER — AMLODIPINE BESYLATE 10 MG PO TABS
10.0000 mg | ORAL_TABLET | Freq: Every day | ORAL | Status: DC
  Administered 2021-05-17 – 2021-05-20 (×4): 10 mg via ORAL
  Filled 2021-05-16 (×4): qty 1

## 2021-05-16 MED ORDER — ONDANSETRON HCL 4 MG PO TABS: 4.0000 mg | ORAL_TABLET | Freq: Four times a day (QID) | ORAL | Status: DC | PRN

## 2021-05-16 MED ORDER — ROCURONIUM BROMIDE 10 MG/ML (PF) SYRINGE
PREFILLED_SYRINGE | INTRAVENOUS | Status: DC | PRN
  Administered 2021-05-16: 70 mg via INTRAVENOUS

## 2021-05-16 MED ORDER — DEXAMETHASONE SODIUM PHOSPHATE 10 MG/ML IJ SOLN
INTRAMUSCULAR | Status: DC | PRN
  Administered 2021-05-16: 8 mg via INTRAVENOUS

## 2021-05-16 MED ORDER — ACETAMINOPHEN 10 MG/ML IV SOLN
INTRAVENOUS | Status: AC
  Administered 2021-05-16: 1000 mg via INTRAVENOUS
  Filled 2021-05-16: qty 100

## 2021-05-16 MED ORDER — SULFASALAZINE 500 MG PO TABS
1000.0000 mg | ORAL_TABLET | Freq: Two times a day (BID) | ORAL | Status: DC
  Filled 2021-05-16: qty 2

## 2021-05-16 MED ORDER — TAMSULOSIN HCL 0.4 MG PO CAPS
0.8000 mg | ORAL_CAPSULE | Freq: Every day | ORAL | Status: DC
  Administered 2021-05-17 – 2021-05-20 (×4): 0.8 mg via ORAL
  Filled 2021-05-16 (×4): qty 2

## 2021-05-16 MED ORDER — SODIUM CHLORIDE 0.9 % IV SOLN
INTRAVENOUS | Status: AC
  Filled 2021-05-16: qty 20

## 2021-05-16 MED ORDER — VANCOMYCIN HCL 1000 MG IV SOLR
INTRAVENOUS | Status: DC | PRN
  Administered 2021-05-16: 2000 mg

## 2021-05-16 MED ORDER — CARVEDILOL 6.25 MG PO TABS
6.2500 mg | ORAL_TABLET | Freq: Once | ORAL | Status: AC
  Administered 2021-05-16: 6.25 mg via ORAL
  Filled 2021-05-16: qty 1

## 2021-05-16 MED ORDER — HYDROMORPHONE HCL 1 MG/ML IJ SOLN
0.2500 mg | INTRAMUSCULAR | Status: DC | PRN
  Administered 2021-05-16 (×3): 0.5 mg via INTRAVENOUS

## 2021-05-16 MED ORDER — ONDANSETRON HCL 4 MG/2ML IJ SOLN: 4.0000 mg | Freq: Four times a day (QID) | INTRAMUSCULAR | Status: DC | PRN

## 2021-05-16 MED ORDER — STERILE WATER FOR IRRIGATION IR SOLN
Status: DC | PRN
  Administered 2021-05-16: 2000 mL

## 2021-05-16 MED ORDER — TRANEXAMIC ACID 1000 MG/10ML IV SOLN: 1000.0000 mg | INTRAVENOUS | Status: DC

## 2021-05-16 MED ORDER — DEXTROSE 5 % IV SOLN
INTRAVENOUS | Status: DC | PRN
  Administered 2021-05-16: 2 g via INTRAVENOUS

## 2021-05-16 MED ORDER — PHENYLEPHRINE HCL-NACL 20-0.9 MG/250ML-% IV SOLN
INTRAVENOUS | Status: DC | PRN
  Administered 2021-05-16: 100 ug/min via INTRAVENOUS

## 2021-05-16 MED ORDER — METOCLOPRAMIDE HCL 5 MG/ML IJ SOLN: 5.0000 mg | Freq: Three times a day (TID) | INTRAMUSCULAR | Status: DC | PRN

## 2021-05-16 MED ORDER — DOCUSATE SODIUM 100 MG PO CAPS
100.0000 mg | ORAL_CAPSULE | Freq: Two times a day (BID) | ORAL | Status: DC
  Administered 2021-05-16 – 2021-05-20 (×8): 100 mg via ORAL
  Filled 2021-05-16 (×8): qty 1

## 2021-05-16 MED ORDER — PROMETHAZINE HCL 25 MG/ML IJ SOLN: 6.2500 mg | INTRAMUSCULAR | Status: DC | PRN

## 2021-05-16 MED ORDER — CHLORHEXIDINE GLUCONATE 0.12 % MT SOLN
15.0000 mL | Freq: Once | OROMUCOSAL | Status: AC
  Administered 2021-05-16: 15 mL via OROMUCOSAL

## 2021-05-16 MED ORDER — CARVEDILOL 6.25 MG PO TABS
6.2500 mg | ORAL_TABLET | Freq: Two times a day (BID) | ORAL | Status: DC
Start: 2021-05-16 — End: 2021-05-20
  Administered 2021-05-16 – 2021-05-20 (×8): 6.25 mg via ORAL
  Filled 2021-05-16 (×8): qty 1

## 2021-05-16 MED ORDER — BUPIVACAINE HCL (PF) 0.5 % IJ SOLN
INTRAMUSCULAR | Status: DC | PRN
Start: 2021-05-16 — End: 2021-05-16
  Administered 2021-05-16: 10 mL via PERINEURAL

## 2021-05-16 MED ORDER — ACETAMINOPHEN 325 MG PO TABS: 325.0000 mg | ORAL_TABLET | Freq: Once | ORAL | Status: DC | PRN

## 2021-05-16 MED ORDER — VANCOMYCIN HCL 1000 MG IV SOLR
INTRAVENOUS | Status: DC | PRN
  Administered 2021-05-16: 1000 mg via INTRAVENOUS

## 2021-05-16 MED ORDER — PRAVASTATIN SODIUM 20 MG PO TABS
80.0000 mg | ORAL_TABLET | Freq: Every day | ORAL | Status: DC
Start: 2021-05-17 — End: 2021-05-20
  Administered 2021-05-17 – 2021-05-20 (×4): 80 mg via ORAL
  Filled 2021-05-16 (×5): qty 4

## 2021-05-16 MED ORDER — FENTANYL CITRATE PF 50 MCG/ML IJ SOSY
50.0000 ug | PREFILLED_SYRINGE | INTRAMUSCULAR | Status: DC
  Administered 2021-05-16: 50 ug via INTRAVENOUS
  Filled 2021-05-16: qty 2

## 2021-05-16 MED ORDER — ACETAMINOPHEN 10 MG/ML IV SOLN: 1000.0000 mg | Freq: Once | INTRAVENOUS | Status: AC

## 2021-05-16 MED ORDER — TRANEXAMIC ACID-NACL 1000-0.7 MG/100ML-% IV SOLN
1000.0000 mg | INTRAVENOUS | Status: AC
  Administered 2021-05-16: 1000 mg via INTRAVENOUS
  Filled 2021-05-16: qty 100

## 2021-05-16 MED ORDER — CEFTRIAXONE SODIUM 2 G IJ SOLR
2.0000 g | INTRAMUSCULAR | Status: DC
  Administered 2021-05-17 – 2021-05-20 (×4): 2 g via INTRAVENOUS
  Filled 2021-05-16 (×6): qty 20

## 2021-05-16 MED ORDER — FENTANYL CITRATE (PF) 100 MCG/2ML IJ SOLN
INTRAMUSCULAR | Status: AC
  Filled 2021-05-16: qty 2

## 2021-05-16 MED ORDER — AMISULPRIDE (ANTIEMETIC) 5 MG/2ML IV SOLN: 10.0000 mg | Freq: Once | INTRAVENOUS | Status: DC | PRN

## 2021-05-16 MED ORDER — PROPOFOL 10 MG/ML IV BOLUS
INTRAVENOUS | Status: DC | PRN
  Administered 2021-05-16: 120 mg via INTRAVENOUS

## 2021-05-16 MED ORDER — ACETAMINOPHEN 325 MG PO TABS
325.0000 mg | ORAL_TABLET | Freq: Four times a day (QID) | ORAL | Status: DC | PRN
  Administered 2021-05-18 – 2021-05-20 (×3): 650 mg via ORAL
  Filled 2021-05-16 (×3): qty 2

## 2021-05-16 MED ORDER — ENSURE ENLIVE PO LIQD
237.0000 mL | Freq: Two times a day (BID) | ORAL | Status: DC
  Administered 2021-05-17 – 2021-05-20 (×8): 237 mL via ORAL

## 2021-05-16 MED ORDER — DEXAMETHASONE SODIUM PHOSPHATE 10 MG/ML IJ SOLN
INTRAMUSCULAR | Status: AC
  Filled 2021-05-16: qty 1

## 2021-05-16 MED ORDER — SULFASALAZINE 500 MG PO TABS
1000.0000 mg | ORAL_TABLET | Freq: Two times a day (BID) | ORAL | Status: DC
  Administered 2021-05-17 – 2021-05-20 (×7): 1000 mg via ORAL
  Filled 2021-05-16 (×7): qty 2

## 2021-05-16 MED ORDER — BUPIVACAINE LIPOSOME 1.3 % IJ SUSP
INTRAMUSCULAR | Status: DC | PRN
  Administered 2021-05-16: 10 mL via PERINEURAL

## 2021-05-16 SURGICAL SUPPLY — 76 items
BAG COUNTER SPONGE SURGICOUNT (BAG) IMPLANT
BAG ZIPLOCK 12X15 (MISCELLANEOUS) ×2 IMPLANT
BLADE SAW SGTL 83.5X18.5 (BLADE) IMPLANT
BOWL SMART MIX CTS (DISPOSABLE) ×1 IMPLANT
CEMENT GENTAMICIN CEMEX 40G (Cement) ×1 IMPLANT
COOLER ICEMAN CLASSIC (MISCELLANEOUS) IMPLANT
COVER BACK TABLE 60X90IN (DRAPES) ×2 IMPLANT
COVER SURGICAL LIGHT HANDLE (MISCELLANEOUS) ×2 IMPLANT
DERMABOND ADVANCED (GAUZE/BANDAGES/DRESSINGS) ×1
DERMABOND ADVANCED .7 DNX12 (GAUZE/BANDAGES/DRESSINGS) ×1 IMPLANT
DRAPE INCISE IOBAN 66X45 STRL (DRAPES) IMPLANT
DRAPE ORTHO SPLIT 77X108 STRL (DRAPES) ×4
DRAPE SHEET LG 3/4 BI-LAMINATE (DRAPES) ×2 IMPLANT
DRAPE SURG 17X11 SM STRL (DRAPES) ×2 IMPLANT
DRAPE SURG ORHT 6 SPLT 77X108 (DRAPES) ×2 IMPLANT
DRAPE TOP 10253 STERILE (DRAPES) ×1 IMPLANT
DRAPE U-SHAPE 47X51 STRL (DRAPES) ×2 IMPLANT
DRESSING PEEL AND PLC PRVNA 13 (GAUZE/BANDAGES/DRESSINGS) IMPLANT
DRSG AQUACEL AG ADV 3.5X10 (GAUZE/BANDAGES/DRESSINGS) ×2 IMPLANT
DRSG PEEL AND PLACE PREVENA 13 (GAUZE/BANDAGES/DRESSINGS) ×2
DRSG TEGADERM 8X12 (GAUZE/BANDAGES/DRESSINGS) ×2 IMPLANT
DURAPREP 26ML APPLICATOR (WOUND CARE) ×2 IMPLANT
ELECT BLADE TIP CTD 4 INCH (ELECTRODE) ×2 IMPLANT
ELECT PENCIL ROCKER SW 15FT (MISCELLANEOUS) ×2 IMPLANT
ELECT REM PT RETURN 15FT ADLT (MISCELLANEOUS) ×2 IMPLANT
FACESHIELD WRAPAROUND (MASK) ×6 IMPLANT
FACESHIELD WRAPAROUND OR TEAM (MASK) ×3 IMPLANT
GLOVE SRG 8 PF TXTR STRL LF DI (GLOVE) ×1 IMPLANT
GLOVE SURG ENC MOIS LTX SZ7 (GLOVE) ×2 IMPLANT
GLOVE SURG ENC MOIS LTX SZ7.5 (GLOVE) ×2 IMPLANT
GLOVE SURG UNDER POLY LF SZ7 (GLOVE) ×2 IMPLANT
GLOVE SURG UNDER POLY LF SZ8 (GLOVE) ×2
GOWN STRL REUS W/TWL LRG LVL3 (GOWN DISPOSABLE) ×4 IMPLANT
HANDPIECE INTERPULSE COAX TIP (DISPOSABLE) ×2
KIT BASIN OR (CUSTOM PROCEDURE TRAY) ×2 IMPLANT
KIT DRSG PREVENA PLUS 7DAY 125 (MISCELLANEOUS) ×1 IMPLANT
KIT SHOULDER SPACER 7MM STEM (Shoulder) ×1 IMPLANT
KIT TURNOVER KIT A (KITS) IMPLANT
MANIFOLD NEPTUNE II (INSTRUMENTS) ×2 IMPLANT
NDL TAPERED W/ NITINOL LOOP (MISCELLANEOUS) IMPLANT
NEEDLE TAPERED W/ NITINOL LOOP (MISCELLANEOUS) IMPLANT
NS IRRIG 1000ML POUR BTL (IV SOLUTION) ×2 IMPLANT
OSTEOTOME THIN 6.0 3 (INSTRUMENTS) ×1 IMPLANT
OSTEOTOME THIN 6.0 6 (INSTRUMENTS) ×1 IMPLANT
OSTEOTOME U 13.5 8 (MISCELLANEOUS) ×1 IMPLANT
PACK SHOULDER (CUSTOM PROCEDURE TRAY) ×2 IMPLANT
PAD ARMBOARD 7.5X6 YLW CONV (MISCELLANEOUS) ×4 IMPLANT
PAD COLD SHLDR WRAP-ON (PAD) IMPLANT
PROTECTOR NERVE ULNAR (MISCELLANEOUS) ×2 IMPLANT
RESTRAINT HEAD UNIVERSAL NS (MISCELLANEOUS) ×2 IMPLANT
SET HNDPC FAN SPRY TIP SCT (DISPOSABLE) IMPLANT
SLING ARM FOAM STRAP LRG (SOFTGOODS) IMPLANT
SLING ARM FOAM STRAP MED (SOFTGOODS) IMPLANT
SLING ARM FOAM STRAP XLG (SOFTGOODS) ×1 IMPLANT
SMARTMIX MINI TOWER (MISCELLANEOUS)
SPONGE T-LAP 18X18 ~~LOC~~+RFID (SPONGE) ×2 IMPLANT
SPONGE T-LAP 4X18 ~~LOC~~+RFID (SPONGE) ×4 IMPLANT
SUCTION FRAZIER HANDLE 12FR (TUBING) ×4
SUCTION TUBE FRAZIER 12FR DISP (TUBING) ×1 IMPLANT
SUT ETHILON 2 0 PS N (SUTURE) ×2 IMPLANT
SUT MNCRL AB 3-0 PS2 18 (SUTURE) ×2 IMPLANT
SUT MON AB 2-0 CT1 36 (SUTURE) ×2 IMPLANT
SUT PDS AB 1 CTX 36 (SUTURE) ×2 IMPLANT
SUT VIC AB 1 CT1 36 (SUTURE) ×2 IMPLANT
SUT VIC AB 2-0 CT1 27 (SUTURE) ×2
SUT VIC AB 2-0 CT1 TAPERPNT 27 (SUTURE) ×1 IMPLANT
SUTURE TAPE 1.3 40 TPR END (SUTURE) IMPLANT
SUTURETAPE 1.3 40 TPR END (SUTURE)
SWAB COLLECTION DEVICE MRSA (MISCELLANEOUS) IMPLANT
SWAB CULTURE ESWAB REG 1ML (MISCELLANEOUS) IMPLANT
TOWEL OR 17X26 10 PK STRL BLUE (TOWEL DISPOSABLE) ×2 IMPLANT
TOWEL OR NON WOVEN STRL DISP B (DISPOSABLE) ×2 IMPLANT
TOWER SMARTMIX MINI (MISCELLANEOUS) IMPLANT
TUBE SUCTION HIGH CAP CLEAR NV (SUCTIONS) ×1 IMPLANT
WATER STERILE IRR 1000ML POUR (IV SOLUTION) ×2 IMPLANT
YANKAUER SUCT BULB TIP 10FT TU (MISCELLANEOUS) ×2 IMPLANT

## 2021-05-16 NOTE — Plan of Care (Signed)
°  Problem: Clinical Measurements: Goal: Diagnostic test results will improve Outcome: Progressing   Problem: Clinical Measurements: Goal: Respiratory complications will improve Outcome: Progressing   Problem: Activity: Goal: Risk for activity intolerance will decrease Outcome: Progressing   Problem: Coping: Goal: Level of anxiety will decrease Outcome: Progressing   Problem: Elimination: Goal: Will not experience complications related to bowel motility Outcome: Progressing   Problem: Safety: Goal: Ability to remain free from injury will improve Outcome: Progressing   Problem: Skin Integrity: Goal: Risk for impaired skin integrity will decrease Outcome: Progressing

## 2021-05-16 NOTE — Anesthesia Procedure Notes (Signed)
Procedure Name: Intubation Date/Time: 05/16/2021 5:12 PM Performed by: Cleda Daub, CRNA Pre-anesthesia Checklist: Patient identified, Emergency Drugs available, Suction available and Patient being monitored Patient Re-evaluated:Patient Re-evaluated prior to induction Oxygen Delivery Method: Circle system utilized Preoxygenation: Pre-oxygenation with 100% oxygen Induction Type: IV induction Ventilation: Mask ventilation without difficulty Laryngoscope Size: Mac and 3 Grade View: Grade I Tube type: Oral Tube size: 7.5 mm Number of attempts: 1 Airway Equipment and Method: Stylet and Oral airway Placement Confirmation: ETT inserted through vocal cords under direct vision, positive ETCO2 and breath sounds checked- equal and bilateral Secured at: 23 cm Tube secured with: Tape Dental Injury: Teeth and Oropharynx as per pre-operative assessment

## 2021-05-16 NOTE — Progress Notes (Signed)
Order noted for PICC insertion. S/P R shoulder surgery and anesthesia. PICC will be placed on 05/17/21.

## 2021-05-16 NOTE — H&P (Signed)
Levi Turner    Chief Complaint: Infected Right Reverse shoulder arthroplasty HPI: The patient is a 71 y.o. male well-known to our practice after a right shoulder reverse arthroplasty that I performed back in September 2013.  He had done well for many years although some follow-up radiographs had shown some significant scapular notching.  He had been doing well from a functional standpoint until the last several weeks at which time he had noted some increasing pain.  He was evaluated in the office approximate 2 weeks ago with radiographs concerning for loosening of his prosthesis.  We did send him for an initial diagnostic work-up which included a bone scan which did not show obvious evidence for deep infection but certainly showed evidence for potential loosening.  Subsequent lab studies did show significant elevation of the CRP and ESR.  Patient returned to the office yesterday with the abrupt onset of significant swelling about the anterior aspect of the right upper arm with clinical exam suggestive of an abscess.  Given these clinical findings patient is now brought urgently to the operating room for planned exploration irrigation and debridement with removal of his loose and infected implants and placement of an antibiotic impregnated cement spacer.  Past Medical History:  Diagnosis Date   Anemia    takes Ferrous Sulfate daily   Arthritis    shoulders and knees   Complication of anesthesia ~ 2000   "HARD TO WAKE UP" after first colonscopy   Depression    just b/c out of work d/t pain in shoulder   Difficulty sleeping    takes Melatonin nightly and pain pill   Glaucoma    Hyperlipidemia    takes Simvastatin nightly   Hypertension    takes Lisinopril and Amlodipine daily   Joint pain    Joint swelling    Shoulder pain    RIGHT   Ulcerative colitis (HCC)    recieves IV Remicade for treatment;Mercaptopurine and Lialda daily    Past Surgical History:  Procedure Laterality Date    CARDIAC CATHETERIZATION  2011   COLONOSCOPY     ESOPHAGOGASTRODUODENOSCOPY     FOOT SURGERY  1990's   LEFT; "growth sprouted up"   INGUINAL HERNIA REPAIR  ~ 1962   bilateral   KNEE ARTHROSCOPY  ~ 2009   right   REVERSE SHOULDER ARTHROPLASTY  12/25/2011   Procedure: REVERSE SHOULDER ARTHROPLASTY;  Surgeon: Senaida Lange, MD;  Location: [facility] OR;  Service: Orthopedics;  Laterality: Right;  Right reverse shoulder arthroplasty   REVERSE TOTAL SHOULDER ARTHROPLASTY  12/25/2011   right   SHOULDER OPEN ROTATOR CUFF REPAIR  ~ 2010   left   TOTAL KNEE ARTHROPLASTY  09/17/2011-right   Procedure: TOTAL KNEE ARTHROPLASTY;  Surgeon: Jacki Cones, MD;  Location: [facility] ORS;  Service: Orthopedics;  Laterality: Right;    History reviewed. No pertinent family history.  Social History:  reports that he has quit smoking. His smoking use included cigarettes. He has a 25.00 pack-year smoking history. He has never used smokeless tobacco. He reports current alcohol use. He reports that he does not use drugs.   Medications Prior to Admission  Medication Sig Dispense Refill   acetaminophen (TYLENOL) 500 MG tablet Take 500-1,000 mg by mouth every 6 (six) hours as needed (pain.).     Adalimumab (HUMIRA) 40 MG/0.4ML PSKT Inject into the skin every 14 (fourteen) days.     amLODipine (NORVASC) 10 MG tablet Take 10 mg by mouth in the morning.  azaTHIOprine (IMURAN) 50 MG tablet Take 100 mg by mouth in the morning.     B Complex-C (B-COMPLEX WITH VITAMIN C) tablet Take 2 tablets by mouth every morning.     carvedilol (COREG) 6.25 MG tablet Take 6.25 mg by mouth 2 (two) times daily.     cholecalciferol (VITAMIN D) 1000 UNITS tablet Take 3,000 Units by mouth in the morning.     diphenhydramine-acetaminophen (TYLENOL PM) 25-500 MG TABS tablet Take 1 tablet by mouth at bedtime.     ferrous sulfate 325 (65 FE) MG EC tablet Take 1,300 mg by mouth in the morning and at bedtime. 3 bid     folic acid (FOLVITE) 1 MG tablet  Take 1 mg by mouth in the morning.     MAGNESIUM CARBONATE PO Take 500 mg by mouth in the morning.     Multiple Vitamin (MULTIVITAMIN WITH MINERALS) TABS Take 2 tablets by mouth every morning.     potassium gluconate 595 MG TABS Take 1,190 mg by mouth in the morning.     pravastatin (PRAVACHOL) 80 MG tablet Take 80 mg by mouth in the morning.     Saw Palmetto, Serenoa repens, (SAW PALMETTO PO) Take 320 mg by mouth every morning.     sulfaSALAzine (AZULFIDINE) 500 MG tablet Take 1,000 mg by mouth 2 (two) times daily.     tamsulosin (FLOMAX) 0.4 MG CAPS capsule Take 0.8 mg by mouth in the morning.     telmisartan (MICARDIS) 80 MG tablet Take 80 mg by mouth in the morning.     traMADol (ULTRAM) 50 MG tablet Take 50 mg by mouth every 6 (six) hours as needed for pain.     TURMERIC-GINGER PO Take 2 capsules by mouth in the morning and at bedtime.     vitamin B-12 (CYANOCOBALAMIN) 500 MCG tablet Take 500 mcg by mouth in the morning.       Physical Exam: Inspection of the right shoulder demonstrates marked swelling about the distal aspect of his previous deltopectoral incision with some induration and erythema of the soft tissues.  There is fluctuance with palpation.  He is otherwise neurovascular intact in the right upper extremity.  He has pain with passive motion of the right shoulder.  His active motion is quite limited secondary to pain.  Plain radiographs from our office 2 weeks ago show significant scalloping and erosion about the humeral stem and marked scapular notching beneath the glenoid baseplate.  Vitals  Temp:  [98.1 F (36.7 C)] 98.1 F (36.7 C) (02/09 1357) Pulse Rate:  [94-119] 97 (02/09 1552) Resp:  [0-35] 23 (02/09 1552) BP: (153-188)/(71-103) 163/71 (02/09 1552) SpO2:  [93 %-100 %] 95 % (02/09 1552) Weight:  [70 kg] 70 kg (02/09 1357)  Assessment/Plan  Impression: Infected Right Reverse shoulder arthroplasty  Plan of Action: Procedure(s): Explantation right reverse  shoulder arthroplasty; irrigation and debridement, implantation of antibiotic impregnated cement spacer  Yordan Martindale M Keelie Zemanek 05/16/2021, 4:07 PM Contact # [phone]

## 2021-05-16 NOTE — Progress Notes (Signed)
Pharmacy Antibiotic Note  Levi Turner is a 71 y.o. male admitted on 05/16/2021 with a planned exploration, irrigation and debridement with removal of the loose and infected shoulder implants.  Pharmacy has been consulted to dose vancomycin for right prosthetic shoulder infection  Plan: Vancomycin (1gm given in OR ) then1750mg  IV q24h (AUC 509.1, Scr 0.86) CTX 2gm IV q24h per MD Follow up renal function, cultures and clinical course  Height: 6' (182.9 cm) Weight: 70 kg (154 lb 6.4 oz) IBW/kg (Calculated) : 77.6  Temp (24hrs), Avg:98 F (36.7 C), Min:97.5 F (36.4 C), Max:98.2 F (36.8 C)  Recent Labs  Lab 05/16/21 1400  WBC 10.1  CREATININE 0.86    Estimated Creatinine Clearance: 76.9 mL/min (by C-G formula based on SCr of 0.86 mg/dL).    Allergies  Allergen Reactions   Lisinopril     Pt states it gave him angio edema    Antimicrobials this admission: 2-9 CTX >> 2/9 vanc >>  Dose adjustments this admission:   Microbiology results: 2/9 surgical/deep wound:   Thank you for allowing pharmacy to be a part of this patients care.  Dolly Rias RPh 05/16/2021, 10:17 PM

## 2021-05-16 NOTE — Transfer of Care (Signed)
Immediate Anesthesia Transfer of Care Note  Patient: Levi Turner  Procedure(s) Performed: Explantation right reverse shoulder arthroplasty; irrigation and debridement, implantation of antibiotic impregnated cement spacer (Right: Shoulder)  Patient Location: PACU  Anesthesia Type:GA combined with regional for post-op pain  Level of Consciousness: drowsy and patient cooperative  Airway & Oxygen Therapy: Patient Spontanous Breathing and Patient connected to face mask oxygen  Post-op Assessment: Report given to RN and Post -op Vital signs reviewed and stable  Post vital signs: Reviewed and stable  Last Vitals:  Vitals Value Taken Time  BP 142/73 05/16/21 1957  Temp    Pulse    Resp 28 05/16/21 2000  SpO2    Vitals shown include unvalidated device data.  Last Pain:  Vitals:   05/16/21 1512  TempSrc:   PainSc: 6          Complications: No notable events documented.

## 2021-05-16 NOTE — Anesthesia Procedure Notes (Signed)
Anesthesia Regional Block: Interscalene brachial plexus block   Pre-Anesthetic Checklist: , timeout performed,  Correct Patient, Correct Site, Correct Laterality,  Correct Procedure, Correct Position, site marked,  Risks and benefits discussed,  Surgical consent,  Pre-op evaluation,  At surgeon's request and post-op pain management  Laterality: Right  Prep: chloraprep       Needles:  Injection technique: Single-shot  Needle Type: Echogenic Stimulator Needle     Needle Length: 9cm  Needle Gauge: 21     Additional Needles:   Procedures:,,,, ultrasound used (permanent image in chart),,    Narrative:  Start time: 05/16/2021 3:05 PM End time: 05/16/2021 3:10 PM Injection made incrementally with aspirations every 5 mL.  Performed by: Personally  Anesthesiologist: Effie Berkshire, MD  Additional Notes: Patient tolerated the procedure well. Local anesthetic introduced in an incremental fashion under minimal resistance after negative aspirations. No paresthesias were elicited. After completion of the procedure, no acute issues were identified and patient continued to be monitored by RN.

## 2021-05-16 NOTE — Progress Notes (Signed)
Pt stable on arrival to floor from PACU.

## 2021-05-16 NOTE — Progress Notes (Signed)
AssistedDr. Hollis with right, ultrasound guided, interscalene  block. Side rails up, monitors on throughout procedure. See vital signs in flow sheet. Tolerated Procedure well.  

## 2021-05-16 NOTE — Anesthesia Preprocedure Evaluation (Addendum)
Anesthesia Evaluation  Patient identified by MRN, date of birth, ID band Patient awake    Reviewed: Allergy & Precautions, NPO status , Patient's Chart, lab work & pertinent test results  Airway Mallampati: II  TM Distance: >3 FB Neck ROM: Full    Dental  (+) Teeth Intact, Dental Advisory Given   Pulmonary former smoker,    breath sounds clear to auscultation       Cardiovascular hypertension,  Rhythm:Regular Rate:Normal     Neuro/Psych PSYCHIATRIC DISORDERS Depression    GI/Hepatic Neg liver ROS, PUD,   Endo/Other  negative endocrine ROS  Renal/GU negative Renal ROS     Musculoskeletal  (+) Arthritis ,   Abdominal Normal abdominal exam  (+)   Peds  Hematology negative hematology ROS (+)   Anesthesia Other Findings   Reproductive/Obstetrics                            Anesthesia Physical Anesthesia Plan  ASA: 2  Anesthesia Plan: General   Post-op Pain Management: Regional block   Induction: Intravenous  PONV Risk Score and Plan: 3 and Ondansetron, Dexamethasone and Midazolam  Airway Management Planned: Oral ETT  Additional Equipment: None  Intra-op Plan:   Post-operative Plan: Extubation in OR  Informed Consent: I have reviewed the patients History and Physical, chart, labs and discussed the procedure including the risks, benefits and alternatives for the proposed anesthesia with the patient or authorized representative who has indicated his/her understanding and acceptance.     Dental advisory given  Plan Discussed with: CRNA  Anesthesia Plan Comments:        Anesthesia Quick Evaluation

## 2021-05-16 NOTE — Op Note (Addendum)
05/16/2021  7:40 PM  PATIENT:   Levi Turner  71 y.o. male  PRE-OPERATIVE DIAGNOSIS:  Infected Right Reverse shoulder arthroplasty  POST-OPERATIVE DIAGNOSIS: Same  PROCEDURE: Removal of right shoulder infected reverse arthroplasty with extensive irrigation and debridement of devitalized tissues and removal of retained cement within the humeral canal, and then placement of an antibiotic impregnated cement spacer  SURGEON:  Levi Turner, Metta Clines M.D.  ASSISTANTS: Levi Loges PA-C  ANESTHESIA:   General endotracheal and interscalene block with Exparel  EBL: 200 cc  SPECIMEN: Superficial abscess fluid and deep joint fluid and tissue from the capsule were all sent for routine culture and sensitivity  Drains: No deep drains.  A Prevena vacuum-assisted closure dressing was applied   PATIENT DISPOSITION:  PACU - hemodynamically stable.    PLAN OF CARE: Admit to inpatient   Brief history:  Levi Turner is a 71 year old gentleman well-known to our practice after a right shoulder reverse arthroplasty that I performed back in 2013.  He did well for many years.  He presented to our office several weeks ago with some complaints of increasing right shoulder pain.  Radiographs at that time were concerning for possible loosening of the implant.  We did send him for diagnostic work-up and he returned to the office yesterday with acutely increased pain and localized swelling at the anterior aspect of the upper arm.  He had an area of erythema and fluctuance concerning for an abscess.  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  He is brought to the operating room this evening urgently for planned exploration irrigation and debridement of the abscess and removal of the infected prosthesis.  Preoperatively, I counseled the patient regarding treatment options and risks versus benefits thereof.   Possible surgical complications were all reviewed including potential for bleeding, infection, neurovascular injury, persistent pain, loss of motion, anesthetic complication, failure of the implant, and possible need for additional surgery. They understand and accept and agrees with our planned procedure.   Procedure in detail:  After undergoing routine preop evaluation t the patient received an interscalene block with Exparel was established in the holding area by the anesthesia department.  Prophylactic antibiotics were held until we could obtain intraoperative cultures.  Patient subsequently placed supine on the operating table and underwent the smooth induction of general endotracheal anesthesia.  Placed into the beachchair position and appropriately padded and protected.  The right shoulder girdle region was sterilely prepped and draped in standard fashion.  Timeout was called.  Inspection of the shoulder demonstrated local area of swelling at the distal aspect of his previous anterior incision with induration and fluctuance.  We made a longitudinal 12 cm incision along his previous incision with skin flaps elevated and dissection carried deeply.  The deltopectoral interval was then identified.  At the distal aspect of the deltopectoral interval as dissection was carried deeply we entered the abscess cavity and cultures were obtained of this fluid.  We then opened the deltopectoral interval from proximal to distal and then perform subperiosteal dissection around the proximal humerus and found multiple areas of loculated purulence and granulation tissue and evidence that there has been a longstanding infection about the proximal aspect of the implant.  Significant inflammatory tissue and fibrotic overgrowth was encountered and extensive dissection of this inflammatory granulation tissue was performed to gain complete visualization about the prosthetic joint and the proximal humerus.  We were ultimately  able to  disc locate the implant.  We took a portion of the capsular tissue and sent this for routine culture and in addition took a specimen of the joint fluid and sent this for culture and sensitivity as well.  We we then gained circumferential exposure around the proximal humerus which showed significant erosion of the metaphyseal bone around the implant.  The polyethylene liner was removed and a clamp was placed about the proximal aspect of the stem and the stem was removed without difficulty.  This unfortunately left a large sleeve of cement from the previous fixation.  We went ahead and piecemeal chipped out as much cement as possible extending down almost to the final pedestal.  There was a cement spacer distally.  In the process of manipulating the canal removing cement we did sustain a fracture of the humeral shaft at the distal aspect of the cement mantle although this was not significantly destabilizing.  With this we abandon any further attempts at removing the cement to prevent any further damage to the humeral cortical bone.  Then turned our attention proximally where we gained exposure of the glenosphere and performed a circumferential resection of the fibrotic granulation tissue that had developed around the glenosphere.  With exposure the glenosphere was then removed using the appropriate screwdriver and the baseplate was then found to be markedly loose with evidence for chronic infection and erosion of the underlying bone.  The peripheral locking screws were all removed and we found that the glenoid had severely eroded such that at least the inferior half of the glenoid was completely absent and filled with granulation tissue.  Extensive debridement of the inflammatory tissue and granulation tissue was then performed.  Once we had achieved clean appearing margins of all the soft tissue planes proximally and distally we then performed pulsatile lavage irrigation.  Final hemostasis was obtained.  At  this time we selected the large [employer] antibiotic impregnated cement spacer and mixed 1 batch of LV cement that contain both gentamicin and 1 g of vancomycin and the cement was allowed to become moderately hardened and then was finger packed into the proximal humerus and then the stem was seated approximately 30 degrees retroversion and appropriate elevation in the cement was then allowed to harden.  The cement spacer head was then reintroduced into the glenoid cavity with good alignment achieved.  Final irrigation was completed.  An additional gram of vancomycin powder was then spread into the soft tissue layers.  The deltopectoral interval was reapproximated with a series of figure-of-eight #1 PDS sutures.  The skin was closed with interrupted vertical mattress 2-0 nylon sutures.  A Prevena vacuum closure dressing was applied over the incision and placed to suction.  The right arm was then placed into a sling.  The patient was then awakened, extubated, and taken to the recovery room in stable condition.  Levi Loges, PA-C was utilized as an Environmental consultant throughout this case, essential for help with positioning the patient, positioning extremity, tissue manipulation, implantation of the prosthesis, suture management, wound closure, and intraoperative decision-making.  I have contacted infectious disease to assist with antibiotic management and long-term treatment plan.  Marin Shutter MD   Contact # [phone]

## 2021-05-16 NOTE — Anesthesia Postprocedure Evaluation (Signed)
Anesthesia Post Note  Patient: Levi Turner  Procedure(s) Performed: Explantation right reverse shoulder arthroplasty; irrigation and debridement, implantation of antibiotic impregnated cement spacer (Right: Shoulder)     Patient location during evaluation: PACU Anesthesia Type: General and Regional Level of consciousness: awake and alert, oriented and patient cooperative Pain management: pain level controlled Vital Signs Assessment: post-procedure vital signs reviewed and stable Respiratory status: spontaneous breathing, nonlabored ventilation and respiratory function stable Cardiovascular status: blood pressure returned to baseline and stable Postop Assessment: no apparent nausea or vomiting Anesthetic complications: no   No notable events documented.  Last Vitals:  Vitals:   05/16/21 1552 05/16/21 1958  BP: (!) 163/71   Pulse: 97   Resp: (!) 23   Temp:  (!) 36.4 C  SpO2: 95% 100%    Last Pain:  Vitals:   05/16/21 1958  TempSrc:   PainSc: [place]

## 2021-05-17 ENCOUNTER — Encounter (HOSPITAL_COMMUNITY): Payer: Self-pay | Admitting: Orthopedic Surgery

## 2021-05-17 DIAGNOSIS — Z4731 Aftercare following explantation of shoulder joint prosthesis: Secondary | ICD-10-CM | POA: Diagnosis not present

## 2021-05-17 MED ORDER — CHLORHEXIDINE GLUCONATE CLOTH 2 % EX PADS
6.0000 | MEDICATED_PAD | Freq: Every day | CUTANEOUS | Status: DC
  Administered 2021-05-17 – 2021-05-20 (×3): 6 via TOPICAL

## 2021-05-17 MED ORDER — ADULT MULTIVITAMIN W/MINERALS CH
1.0000 | ORAL_TABLET | Freq: Every day | ORAL | Status: DC
  Administered 2021-05-17 – 2021-05-20 (×4): 1 via ORAL
  Filled 2021-05-17 (×4): qty 1

## 2021-05-17 MED ORDER — SODIUM CHLORIDE 0.9 % IV BOLUS
500.0000 mL | Freq: Once | INTRAVENOUS | Status: AC
  Administered 2021-05-17: 500 mL via INTRAVENOUS

## 2021-05-17 MED ORDER — SODIUM CHLORIDE 0.9% FLUSH: 10.0000 mL | INTRAVENOUS | Status: DC | PRN

## 2021-05-17 MED ORDER — SODIUM CHLORIDE 0.9% FLUSH
10.0000 mL | Freq: Two times a day (BID) | INTRAVENOUS | Status: DC
  Administered 2021-05-18: 10 mL

## 2021-05-17 NOTE — Plan of Care (Signed)
  Problem: Pain Managment: Goal: General experience of comfort will improve Outcome: Progressing   Problem: Safety: Goal: Ability to remain free from injury will improve Outcome: Progressing   

## 2021-05-17 NOTE — Progress Notes (Signed)
Levi Turner  MRN: [id number] DOB/Age: Apr 28, 1950 71 y.o. Physician: Ander Slade, M.D. 1 Day Post-Op Procedure(s) (LRB): Explantation right reverse shoulder arthroplasty; irrigation and debridement, implantation of antibiotic impregnated cement spacer (Right)  Subjective: Patient resting comfortably in bedside chair during today's visit.  Reports minimal shoulder and arm pain.  Does report having felt "dizzy" during ambulation with therapy.  Nursing staff contacted our office regarding some hypotension.  This has resolved with supine positioning and hydration.  Currently completing a fluid bolus.  Fair appetite.  Denies nausea Vital Signs Temp:  [97.5 F (36.4 C)-98.9 F (37.2 C)] 98.9 F (37.2 C) (02/10 1023) Pulse Rate:  [73-119] 90 (02/10 1029) Resp:  [0-35] 14 (02/10 1023) BP: (95-188)/(55-103) 126/60 (02/10 1029) SpO2:  [93 %-100 %] 99 % (02/10 1023) Weight:  [70 kg] 70 kg (02/09 1357)  Lab Results Recent Labs    05/16/21 1400  WBC 10.1  HGB 11.0*  HCT 35.0*  PLT 564*   BMET Recent Labs    05/16/21 1400  NA 134*  K 4.3  CL 99  CO2 26  GLUCOSE 109*  BUN 11  CREATININE 0.86  CALCIUM 9.8   INR  Date Value Ref Range Status  09/10/2011 0.96 0.00 - 1.49 Final     Exam  Postop vacuum dressing intact.  Minimal bloody drainage in vacuum canister.  Vacuum is intact.  Intact light touch sensation in the axillary nerve distribution.  Grossly neurovascular intact distally in the right upper extremity.  Moderate swelling of the right arm as would be expected.  Intraoperative cultures no growth so far.  Multiple white blood cells noted.  Impression:  Status post removal of grossly infected right shoulder reverse arthroplasty.  There was significant bony destruction and thinning of the humeral cortex with intraoperative fracture during extraction of the implant and cement.  Additionally, there was noted to be almost complete erosion of the glenoid with severe bone  loss.  Plan I have counseled the patient regarding the intraoperative findings.  We have again outlined treatment plan from this point going forward.  I have spoken with infectious disease who will be following the patient and providing guidance regarding antibiotic selection.  Anticipate a minimum of 6 to 8 weeks IV treatment with adjustments as per the isolated organism.  Regarding the humeral shaft fracture I am hopeful that this will be amenable to conservative management with simply sling immobilization.  There is a small amount of retained cement in this may potentially be a source of persistent infection and based on his clinical progress we may need to consider further surgery for definitive removal although this would carry with it potential for significant morbidity related to the dissection that would be required and the fact that it would require a large window and osteotomy of the humeral shaft.  Alternatively we may opt for maintaining his current cement prosthesis on a long-term basis and simply following his progress clinically.  At this time he does appear to be orthostatic with his ambulation activities and would recommend that he continue with hydration and that antihypertensives be held for systolic blood pressure less than 120.  Repeat CBC in a.m.  Anticipate discharge Monday or Tuesday of next week.  I am leaving town in the morning and will not be back until next Thursday.  My PA, Jenetta Loges, as well as my partners will be available for any questions regarding patient management.  Levi Turner 05/17/2021, 1:24 PM   Contact # [phone]

## 2021-05-17 NOTE — Evaluation (Addendum)
Physical Therapy Evaluation Patient Details Name: Levi Turner MRN: [id number] DOB: [birth year] Today's Date: 05/17/2021  History of Present Illness  71 y.o. male admitted with infected R reverse total shoulder arthoplasty. s/p TSA resection, I&D, and placement of wound VAC 05/16/21. PMH includes depression, HTN, R reverse TSA 2013, R TKA.  Clinical Impression  Pt admitted with above diagnosis. Min assist for bed mobility and to transfer to recliner. Pt dizzy in standing so ambulation was deferred. BP in recliner was 112/68. RN  reported pt had episode of dizziness and hypotension earlier this morning, he is receiving a bolus presently. Good progress with ambulation expected once dizziness resolves. Instructed pt in hand ROM exercises for edema control.  I put in an OT order as well. Pt currently with functional limitations due to the deficits listed below (see PT Problem List). Pt will benefit from skilled PT to increase their independence and safety with mobility to allow discharge to the venue listed below.          Recommendations for follow up therapy are one component of a multi-disciplinary discharge planning process, led by the attending physician.  Recommendations may be updated based on patient status, additional functional criteria and insurance authorization.  Follow Up Recommendations Follow physician's recommendations for discharge plan and follow up therapies    Assistance Recommended at Discharge Intermittent Supervision/Assistance  Patient can return home with the following  A little help with bathing/dressing/bathroom;Assist for transportation;Help with stairs or ramp for entrance    Equipment Recommendations None recommended by PT  Recommendations for Other Services       Functional Status Assessment Patient has had a recent decline in their functional status and demonstrates the ability to make significant improvements in function in a reasonable and predictable amount  of time.     Precautions / Restrictions Precautions Precautions: Shoulder Shoulder Interventions: Shoulder sling/immobilizer Restrictions Weight Bearing Restrictions: Yes RUE Weight Bearing: Non weight bearing Other Position/Activity Restrictions: instructed pt in NWB status for RUE      Mobility  Bed Mobility Overal bed mobility: Needs Assistance Bed Mobility: Rolling, Sidelying to Sit Rolling: Min assist Sidelying to sit: Mod assist       General bed mobility comments: assist to initiate roll and to raise trunk    Transfers Overall transfer level: Needs assistance Equipment used: 1 person hand held assist Transfers: Sit to/from Stand, Bed to chair/wheelchair/BSC Sit to Stand: Min assist   Step pivot transfers: Min assist       General transfer comment: assist to steady with rising and to take a few pivotal steps to recliner, pt dizzy in standing so did not attempt ambulation. Assisted pt to recliner where BP was 112/68 (taken at R ankle). RN stated pt had episode of dizziness and hypotension earlier this morning, he is receiving a bolus at present.    Ambulation/Gait               General Gait Details: deferred 2* dizziness in standing  Stairs            Wheelchair Mobility    Modified Rankin (Stroke Patients Only)       Balance Overall balance assessment: Needs assistance   Sitting balance-Leahy Scale: Good       Standing balance-Leahy Scale: Fair                               Pertinent Vitals/Pain Pain Assessment Pain Assessment: 0-10  Pain Score: 8  Pain Location: R shoulder Pain Descriptors / Indicators: Sore Pain Intervention(s): Limited activity within patient's tolerance, Repositioned, Monitored during session, Premedicated before session, Ice applied    Home Living Family/patient expects to be discharged to:: Private residence Living Arrangements: Spouse/significant other Available Help at Discharge:  Family;Available 24 hours/day Type of Home: House Home Access: Stairs to enter Entrance Stairs-Rails: None Entrance Stairs-Number of Steps: 2   Home Layout: Laundry or work area in basement;Able to live on main level with bedroom/bathroom Home Equipment: Kasandra Knudsen - single point;Wheelchair - power;Wheelchair - Publishing copy (2 wheels);Other (comment) Additional Comments: has a knee scooter    Prior Function Prior Level of Function : Independent/Modified Independent             Mobility Comments: walks without AD, no falls in past 6 months ADLs Comments: independent     Hand Dominance        Extremity/Trunk Assessment   Upper Extremity Assessment Upper Extremity Assessment: Defer to OT evaluation;RUE deficits/detail RUE Deficits / Details: grip and sensation intact R hand RUE: Unable to fully assess due to immobilization    Lower Extremity Assessment Lower Extremity Assessment: Overall WFL for tasks assessed    Cervical / Trunk Assessment Cervical / Trunk Assessment: Normal  Communication   Communication: No difficulties  Cognition Arousal/Alertness: Awake/alert Behavior During Therapy: WFL for tasks assessed/performed Overall Cognitive Status: Within Functional Limits for tasks assessed                                          General Comments      Exercises  Composite finger flexion R hand x 10 AROM seated   Assessment/Plan    PT Assessment Patient needs continued PT services  PT Problem List Decreased activity tolerance;Decreased balance;Pain;Decreased mobility       PT Treatment Interventions DME instruction;Therapeutic activities;Therapeutic exercise;Functional mobility training;Patient/family education;Gait training;Stair training    PT Goals (Current goals can be found in the Care Plan section)  Acute Rehab PT Goals Patient Stated Goal: gardening, yardwork PT Goal Formulation: With patient Time For Goal Achievement:  05/31/21 Potential to Achieve Goals: Good    Frequency Min 3X/week     Co-evaluation               AM-PAC PT "6 Clicks" Mobility  Outcome Measure Help needed turning from your back to your side while in a flat bed without using bedrails?: A Little Help needed moving from lying on your back to sitting on the side of a flat bed without using bedrails?: A Little Help needed moving to and from a bed to a chair (including a wheelchair)?: A Little Help needed standing up from a chair using your arms (e.g., wheelchair or bedside chair)?: A Little Help needed to walk in hospital room?: A Little Help needed climbing 3-5 steps with a railing? : A Lot 6 Click Score: 17    End of Session Equipment Utilized During Treatment: Gait belt Activity Tolerance: Treatment limited secondary to medical complications (Comment) (dizziness in standing) Patient left: in chair;with call bell/phone within reach;with chair alarm set Nurse Communication: Mobility status;Other (comment) (dizziness) PT Visit Diagnosis: Unsteadiness on feet (R26.81);Difficulty in walking, not elsewhere classified (R26.2);Pain Pain - Right/Left: Right Pain - part of body: Shoulder    Time: 9983-3825 PT Time Calculation (min) (ACUTE ONLY): 25 min   Charges:   PT Evaluation $  PT Eval Moderate Complexity: 1 Mod PT Treatments $Therapeutic Activity: 8-22 mins       Blondell Reveal Kistler PT 05/17/2021  Acute Rehabilitation Services Pager [phone] Office [phone]

## 2021-05-17 NOTE — Progress Notes (Signed)
Peripherally Inserted Central Catheter Placement  The IV Nurse has discussed with the patient and/or persons authorized to consent for the patient, the purpose of this procedure and the potential benefits and risks involved with this procedure.  The benefits include less needle sticks, lab draws from the catheter, and the patient may be discharged home with the catheter. Risks include, but not limited to, infection, bleeding, blood clot (thrombus formation), and puncture of an artery; nerve damage and irregular heartbeat and possibility to perform a PICC exchange if needed/ordered by physician.  Alternatives to this procedure were also discussed.  Bard Power PICC patient education guide, fact sheet on infection prevention and patient information card has been provided to patient /or left at bedside.    PICC Placement Documentation  PICC Single Lumen 00/45/99 Left Basilic 46 cm (Active)  Indication for Insertion or Continuance of Line Home intravenous therapies (PICC only) 05/17/21 1000  Exposed Catheter (cm) 0 cm 05/17/21 1000  Site Assessment Clean;Dry;Intact 05/17/21 1000  Line Status Flushed;Blood return noted 05/17/21 1000  Dressing Type Transparent 05/17/21 1000  Dressing Status Clean;Dry;Intact 05/17/21 1000  Antimicrobial disc in place? Yes 05/17/21 1000  Dressing Change Due 05/24/21 05/17/21 1000       Jule Economy Horton 05/17/2021, 10:06 AM

## 2021-05-17 NOTE — Progress Notes (Signed)
Patient  called, nurse in room pt said "dizziness, dull chest pain and everything around look dark."" Vital sign obtained, Bp 95/55. Notified MD and new order noted and carried out.

## 2021-05-17 NOTE — Consult Note (Signed)
[facility] for Infectious Disease    Date of Admission:  05/16/2021   Total days of inpatient antibiotics 1        Reason for Consult: Right shoulder PJI    Principal Problem:   Aftercare following explantation of shoulder joint prosthesis   Assessment: 61 YM with ulcerative colitis on Humira seen in Ortho clinic found to have infected reverse right shoulder   arthroplasty on exam and admitted for explantation.   #Infected right reverse shoulder arthroplasty(2013) SP explantation of arthroplasty and antibiotic spacer placement with retained cement with Cx pending on 2/9 #PJI #Ulcerative colitis on Humira Pre-hospitalization antibiotics: none  Risk factors: Pt is on Humira (immunosuppression), Uses machines that require pull start with the right hand.  OR findings: Abscess cavity noted on deltopectoral dissection, multiple areas of purulence during subperiosteal dissection.   Recommendations:  -Continue vancomycin and ceftriaxone.Anticipate 6 weeks of antibiotics followed by suppression as pt has retained cement.  -Follow OR Cx -Has UC followed by GI on Humira. Next dose is due next Sunday. GI provider will need to be contacted, as pt may need to hold Humira in the setting of infection.   Microbiology:   Antibiotics: Vancomycin 2/9-p  Cultures: Other 05/16/21 GS: No organism, Cx NG   HPI: Levi Turner is a 71 y.o. male with anemia, Ulcerative colitis on Humira, HTN, depression, right shoulder reverse arthroplasty  in September, 2013 admitted for infected right shoulder reverse arthoplasty. Followed by Dr. Supple(orthopedics). He started having shoulder pain a few weeks ago. 2 weeks prior to admission Xray showed concern for loosening prosthesis. ESR/CRP elevated. Returned to Ortho office on 2/8 and clinical exam was significant for abscess. Pt direct admitted  and taken to the OR for I&D with removal of infected implants and antibiotic spacer placement.  Today,  pt is resting in bed. He denies any trauma to  his shoulder. He does use pull motor lawn care machine with his right hand. Denies fevers/chills.    Review of Systems: Review of Systems  All other systems reviewed and are negative.  Past Medical History:  Diagnosis Date   Anemia    takes Ferrous Sulfate daily   Arthritis    shoulders and knees   Complication of anesthesia ~ 2000   "HARD TO WAKE UP" after first colonscopy   Depression    just b/c out of work d/t pain in shoulder   Difficulty sleeping    takes Melatonin nightly and pain pill   Glaucoma    Hyperlipidemia    takes Simvastatin nightly   Hypertension    takes Lisinopril and Amlodipine daily   Joint pain    Joint swelling    Shoulder pain    RIGHT   Ulcerative colitis ([place])    recieves IV Remicade for treatment;Mercaptopurine and Lialda daily    Social History   Tobacco Use   Smoking status: Former    Packs/day: 1.00    Years: 25.00    Pack years: 25.00    Types: Cigarettes   Smokeless tobacco: Never   Tobacco comments:    12/25/2011 "stopped smoking cigarettes ~ 20 yr ago"  Vaping Use   Vaping Use: Never used  Substance Use Topics   Alcohol use: Yes    Comment: 12/25/2011 "quit all alcohol in the 1990's; never had problem w/drinking"   Drug use: No    History reviewed. No pertinent family history. Scheduled Meds:  amLODipine  10 mg  Oral Daily   carvedilol  6.25 mg Oral BID   Chlorhexidine Gluconate Cloth  6 each Topical Daily   docusate sodium  100 mg Oral BID   feeding supplement  237 mL Oral BID BM   irbesartan  300 mg Oral Daily   pantoprazole  40 mg Oral Daily   pravastatin  80 mg Oral Daily   sodium chloride flush  10-40 mL Intracatheter Q12H   sulfaSALAzine  1,000 mg Oral BID   tamsulosin  0.8 mg Oral Daily   Continuous Infusions:  cefTRIAXone (ROCEPHIN)  IV     lactated ringers 75 mL/hr at 05/17/21 9935   methocarbamol (ROBAXIN) IV     vancomycin 1,750 mg (05/17/21 1201)   PRN  Meds:.acetaminophen, bisacodyl, diphenhydrAMINE, HYDROmorphone (DILAUDID) injection, menthol-cetylpyridinium **OR** phenol, methocarbamol **OR** methocarbamol (ROBAXIN) IV, metoCLOPramide **OR** metoCLOPramide (REGLAN) injection, ondansetron **OR** ondansetron (ZOFRAN) IV, oxyCODONE, oxyCODONE, polyethylene glycol, sodium chloride flush Allergies  Allergen Reactions   Lisinopril     Pt states it gave him angio edema    OBJECTIVE: Blood pressure (!) 167/67, pulse 91, temperature 99 F (37.2 C), temperature source Oral, resp. rate 15, height 6' (1.829 m), weight 70 kg, SpO2 92 %.  Physical Exam Constitutional:      General: He is not in acute distress.    Appearance: He is normal weight. He is not toxic-appearing.  HENT:     Head: Normocephalic and atraumatic.     Right Ear: External ear normal.     Left Ear: External ear normal.     Nose: No congestion or rhinorrhea.     Mouth/Throat:     Mouth: Mucous membranes are moist.     Pharynx: Oropharynx is clear.  Eyes:     Extraocular Movements: Extraocular movements intact.     Conjunctiva/sclera: Conjunctivae normal.     Pupils: Pupils are equal, round, and reactive to light.  Cardiovascular:     Rate and Rhythm: Normal rate and regular rhythm.     Heart sounds: No murmur heard.   No friction rub. No gallop.  Pulmonary:     Effort: Pulmonary effort is normal.     Breath sounds: Normal breath sounds.  Abdominal:     General: Abdomen is flat. Bowel sounds are normal.     Palpations: Abdomen is soft.  Musculoskeletal:        General: No swelling. Normal range of motion.     Cervical back: Normal range of motion and neck supple.     Comments: Right sling in place  Skin:    General: Skin is warm and dry.  Neurological:     General: No focal deficit present.     Mental Status: He is oriented to person, place, and time.  Psychiatric:        Mood and Affect: Mood normal.    Lab Results Lab Results  Component Value Date   WBC  10.1 05/16/2021   HGB 11.0 (L) 05/16/2021   HCT 35.0 (L) 05/16/2021   MCV 89.1 05/16/2021   PLT 564 (H) 05/16/2021    Lab Results  Component Value Date   CREATININE 0.86 05/16/2021   BUN 11 05/16/2021   NA 134 (L) 05/16/2021   K 4.3 05/16/2021   CL 99 05/16/2021   CO2 26 05/16/2021    Lab Results  Component Value Date   ALT 20 09/10/2011   AST 14 09/10/2011   ALKPHOS 54 09/10/2011   BILITOT 0.4 09/10/2011  Laurice Record, MD [facility] Infectious Disease [facility] 05/17/2021, 2:59 PM

## 2021-05-17 NOTE — Progress Notes (Signed)
Initial Nutrition Assessment  INTERVENTION:   -Ensure Plus High Protein po BID, each supplement provides 350 kcal and 20 grams of protein.   -Multivitamin with minerals daily  -Added "Finger Foods" to regular diet, pt finds it difficult to use utensils with left hand  NUTRITION DIAGNOSIS:   Increased nutrient needs related to post-op healing (acute injury) as evidenced by estimated needs.  GOAL:   Patient will meet greater than or equal to 90% of their needs  MONITOR:   PO intake, Supplement acceptance, Labs, Weight trends, I & O's  REASON FOR ASSESSMENT:   Malnutrition Screening Tool    ASSESSMENT:   Pt admitted with explantation of shoulder joint prosthesis.  2/9: s/p right reverse shoulder arthroplasty  Patient is in room, wife and daughter at bedside.  Pt states he ate a good breakfast, feels hungry but was unable to eat lunch as he couldn't use utensils with his left hand, he is right handed but unable to use his right arm given his shoulder infection. He did drink a Ensure instead, finds this easy to consume.  Pt reports poor appetite lately but still has been eating.   Per weight records, pt has lost 20 lbs, but unsure of time frame. UBW is 175-180 lbs.  Medications: Colace, Lactated ringers  Labs reviewed:  Low Na  NUTRITION - FOCUSED PHYSICAL EXAM:  Flowsheet Row Most Recent Value  Orbital Region No depletion  Upper Arm Region No depletion  [left]  Thoracic and Lumbar Region No depletion  Buccal Region Mild depletion  Temple Region No depletion  Clavicle Bone Region No depletion  Clavicle and Acromion Bone Region No depletion  Scapular Bone Region No depletion  Dorsal Hand No depletion  Edema (RD Assessment) None  Hair Reviewed  Mouth Reviewed  Skin Reviewed       Diet Order:   Diet Order             Diet regular Room service appropriate? Yes; Fluid consistency: Thin  Diet effective now                   EDUCATION NEEDS:    Education needs have been addressed  Skin:  Skin Assessment: Reviewed RN Assessment  Last BM:  2/8  Height:   Ht Readings from Last 1 Encounters:  05/16/21 6' (1.829 m)    Weight:   Wt Readings from Last 1 Encounters:  05/16/21 70 kg    BMI:  Body mass index is 20.94 kg/m.  Estimated Nutritional Needs:   Kcal:  1800-2000  Protein:  85-100g  Fluid:  2L/day  Clayton Bibles, MS, RD, LDN Inpatient Clinical Dietitian Contact information available via Amion

## 2021-05-17 NOTE — TOC Initial Note (Addendum)
Transition of Care [facility]) - Initial/Assessment Note   Patient Details  Name: Levi Turner MRN: [id number] Date of Birth: [birth year]  Transition of Care [facility] CM/SW Contact:    Sherie Don, LCSW Phone Number: 05/17/2021, 12:29 PM  Clinical Narrative: Tentative discharge plan is for the patient to go home with IV antibiotics and HHRN. Referral made to [facility] with [employer], who will follow up with the patient and family today. TOC to follow.  AddendumJeannene Patella with [employer] has set the patient up with [facility] through [facility].  Expected Discharge Plan: [place] Barriers to Discharge: Continued Medical Work up  Patient Goals and CMS Choice Patient states their goals for this hospitalization and ongoing recovery are:: Discharge home with IV antibiotics and HHRN if needed  Expected Discharge Plan and Services Expected Discharge Plan: [place] In-house Referral: Clinical Social Work Post Acute Care Choice: [place] arrangements for the past 2 months: [place]            DME Arranged: N/A DME Agency: NA HH Arranged: IV Antibiotics HH Agency: [employer] Date HH Agency Contacted: 05/17/21 Representative spoke with at [place]: [place]  Prior Living Arrangements/Services Living arrangements for the past 2 months: [place] Lives with:: Spouse Patient language and need for interpreter reviewed:: Yes Do you feel safe going back to the place where you live?: Yes      Need for Family Participation in Patient Care: No (Comment) Care giver support system in place?: Yes (comment) Criminal Activity/Legal Involvement Pertinent to Current Situation/Hospitalization: No - Comment as needed  Activities of Daily Living Home Assistive Devices/Equipment: Eyeglasses, Blood pressure cuff, Walker (specify type), Raised toilet seat with rails, Bedside commode/3-in-1, Shower chair with back, Grab bars in shower, Wheelchair, [employer] (specify quad or  straight) (upper partial not removable, scotter, power wheelchair) ADL Screening (condition at time of admission) Patient's cognitive ability adequate to safely complete daily activities?: Yes Is the patient deaf or have difficulty hearing?: No Does the patient have difficulty seeing, even when wearing glasses/contacts?: No Does the patient have difficulty concentrating, remembering, or making decisions?: No Patient able to express need for assistance with ADLs?: Yes Does the patient have difficulty dressing or bathing?: No Independently performs ADLs?: Yes (appropriate for developmental age) Does the patient have difficulty walking or climbing stairs?: No Weakness of Legs: None Weakness of Arms/Hands: Right  Emotional Assessment Orientation: : Oriented to Self, Oriented to Place, Oriented to  Time, Oriented to Situation Alcohol / Substance Use: Not Applicable Psych Involvement: No (comment)  Admission diagnosis:  Aftercare following explantation of shoulder joint prosthesis [Z47.31] Patient Active Problem List   Diagnosis Date Noted   Aftercare following explantation of shoulder joint prosthesis 05/16/2021   Rotator cuff tear arthropathy of right shoulder 12/26/2011   S/P total knee arthroplasty 09/18/2011   Osteoarthritis of knee 09/17/2011   PCP:  Kristen Loader, FNP Pharmacy:   [facility] ([place]), [place] - [place] [address] [place] (SE) [place] [address] Phone: [phone] Fax: [phone]  [id number] - [place], [place] [address] [place] [address] Phone: [phone] Fax: [phone]  Readmission Risk Interventions No flowsheet data found.

## 2021-05-18 LAB — CBC WITH DIFFERENTIAL/PLATELET
Abs Immature Granulocytes: 0.04 10*3/uL (ref 0.00–0.07)
Basophils Absolute: 0 10*3/uL (ref 0.0–0.1)
Basophils Relative: 0 %
Eosinophils Absolute: 0 10*3/uL (ref 0.0–0.5)
Eosinophils Relative: 0 %
HCT: 28 % — ABNORMAL LOW (ref 39.0–52.0)
Hemoglobin: 8.5 g/dL — ABNORMAL LOW (ref 13.0–17.0)
Immature Granulocytes: 0 %
Lymphocytes Relative: 10 %
Lymphs Abs: 1.2 10*3/uL (ref 0.7–4.0)
MCH: 27.3 pg (ref 26.0–34.0)
MCHC: 30.4 g/dL (ref 30.0–36.0)
MCV: 90 fL (ref 80.0–100.0)
Monocytes Absolute: 2.1 10*3/uL — ABNORMAL HIGH (ref 0.1–1.0)
Monocytes Relative: 18 %
Neutro Abs: 8.3 10*3/uL — ABNORMAL HIGH (ref 1.7–7.7)
Neutrophils Relative %: 72 %
Platelets: 406 10*3/uL — ABNORMAL HIGH (ref 150–400)
RBC: 3.11 MIL/uL — ABNORMAL LOW (ref 4.22–5.81)
RDW: 15.4 % (ref 11.5–15.5)
WBC: 11.7 10*3/uL — ABNORMAL HIGH (ref 4.0–10.5)
nRBC: 0 % (ref 0.0–0.2)

## 2021-05-18 LAB — CREATININE, SERUM
Creatinine, Ser: 0.66 mg/dL (ref 0.61–1.24)
GFR, Estimated: >60 mL/min (ref >60)

## 2021-05-18 NOTE — Progress Notes (Signed)
Orthopedic Tech Progress Note Patient Details:  Levi Turner [birth year] [id number]  Ortho Devices Type of Ortho Device: Abduction pillow Ortho Device/Splint Location: right Don joy shoulder abduction pillow Ortho Device/Splint Interventions: Application   Post Interventions Patient Tolerated: Well Instructions Provided: Care of device, Adjustment of device  [place] Pink 05/18/2021, 11:55 AM

## 2021-05-18 NOTE — Progress Notes (Signed)
PT Cancellation Note  Patient Details Name: KARIEM WOLFSON MRN: [id number] DOB: Mar 15, 1951   Cancelled Treatment:    Reason Eval/Treat Not Completed: Medical issues which prohibited therapy (pt was dizzy in standing when getting from bed to recliner earlier this morning. He's not able to tolerate ambulation at present. Will follow.)  Philomena Doheny PT 05/18/2021  Acute Rehabilitation Services Pager [phone] Office [phone]

## 2021-05-18 NOTE — Evaluation (Addendum)
Occupational Therapy Evaluation Patient Details Name: Levi Turner MRN: [id number] DOB: December 16, 1950 Today's Date: 05/18/2021   History of Present Illness 71 y.o. male admitted with infected R reverse total shoulder arthoplasty. s/p TSA resection, I&D, and placement of wound VAC 05/16/21. PMH includes depression, HTN, R reverse TSA 2013, R TKA.   Clinical Impression   Levi Turner is a 71 year old man s/p shoulder I & D with abx spacer placement without functional use of right dominant upper extremity secondary to effects of surgery, shoulder precautions, edema and pain as well as poor activity tolerance. Patient also has wound vac. Patient required significant assistance for ADLs and min guard assist for transfer to chair only. Patient became symptomatic - reporting dizziness and then stating "It's getting black" after transfer. Patient's blood pressures systolic had been 767M during the morning and BP dropped to 99/63 with standing.  Therapist provided education and instruction to patient and spouse in regards to exercises, precautions, positioning, donning upper extremity clothing and bathing while maintaining shoulder precautions, ice and edema management and donning/doffing sling. However, all aspects of education limited by patient's pain and poor activity tolerance.  Patient will benefit from skilled OT services while in hospital to improve deficits and learn compensatory strategies as needed in order to improve functional abilities. Will work on continued education and advancing ADLs during hospital stay.  Therapist recommends use of ice cuff and cooler for edema and pain management if possible as ice packs due not fully cover the effected area.           Recommendations for follow up therapy are one component of a multi-disciplinary discharge planning process, led by the attending physician.  Recommendations may be updated based on patient status, additional functional criteria and  insurance authorization.   Follow Up Recommendations  Follow physician's recommendations for discharge plan and follow up therapies    Assistance Recommended at Discharge Frequent or constant Supervision/Assistance  Patient can return home with the following A little help with walking and/or transfers;A lot of help with bathing/dressing/bathroom;Assistance with cooking/housework;Help with stairs or ramp for entrance    Functional Status Assessment  Patient has had a recent decline in their functional status and demonstrates the ability to make significant improvements in function in a reasonable and predictable amount of time.  Equipment Recommendations  None recommended by OT    Recommendations for Other Services       Precautions / Restrictions Precautions Precautions: Shoulder Type of Shoulder Precautions: No specific parameters provided by MD - will expect fully conservative approach with no AROM or PROM Shoulder Interventions: Shoulder sling/immobilizer Precaution Booklet Issued:  (handouts provided) Required Braces or Orthoses: Sling Restrictions Weight Bearing Restrictions: Yes RUE Weight Bearing: Non weight bearing      Mobility Bed Mobility Overal bed mobility: Needs Assistance Bed Mobility: Supine to Sit     Supine to sit: Max assist     General bed mobility comments: max assist to transfer to edge of bed needing assistance for trunk and pivoting hips.    Transfers Overall transfer level: Needs assistance Equipment used: 1 person hand held assist Transfers: Sit to/from Stand Sit to Stand: Min guard           General transfer comment: Min guard to take steps to recilner. Mobility and activity tolerane Limited by orthostatic hypotension.      Balance Overall balance assessment: Mild deficits observed, not formally tested  ADL either performed or assessed with clinical judgement   ADL Overall  ADL's : Needs assistance/impaired Eating/Feeding: Set up;Sitting   Grooming: Set up;Sitting   Upper Body Bathing: Moderate assistance;Sitting   Lower Body Bathing: Sit to/from stand;Maximal assistance   Upper Body Dressing : Maximal assistance;Sitting   Lower Body Dressing: Total assistance   Toilet Transfer: BSC/3in1;Min guard   Toileting- Clothing Manipulation and Hygiene: Sit to/from stand;Maximal assistance       Functional mobility during ADLs: Min guard General ADL Comments: Patient min guard to stand and take steps to recliner - limited by complaints of dizziness and then "it's getting black." Significant pain in RUE limiting patient's tolerance and ability to participate.     Vision Patient Visual Report: No change from baseline       Perception     Praxis      Pertinent Vitals/Pain Pain Assessment Pain Assessment: Faces Faces Pain Scale: Hurts whole lot Pain Location: R shoulder - with any movement Pain Descriptors / Indicators: Grimacing, Guarding, Sharp Pain Intervention(s): Limited activity within patient's tolerance, Premedicated before session     Hand Dominance Right   Extremity/Trunk Assessment Upper Extremity Assessment Upper Extremity Assessment: RUE deficits/detail RUE Deficits / Details: AROM limited by immobilization and pain, grossly able to open and close fingers, extend/flex wrist and perform partial supination.   Lower Extremity Assessment Lower Extremity Assessment: Overall WFL for tasks assessed   Cervical / Trunk Assessment Cervical / Trunk Assessment: Normal   Communication Communication Communication: No difficulties   Cognition Arousal/Alertness: Awake/alert Behavior During Therapy: WFL for tasks assessed/performed Overall Cognitive Status: Within Functional Limits for tasks assessed                                       General Comments       Exercises     Shoulder Instructions Shoulder  Instructions Donning/doffing shirt without moving shoulder: Caregiver independent with task Method for sponge bathing under operated UE: Caregiver independent with task Donning/doffing sling/immobilizer: Caregiver independent with task Correct positioning of sling/immobilizer: Caregiver independent with task Sling wearing schedule (on at all times/off for ADL's): Caregiver independent with task Proper positioning of operated UE when showering: Caregiver independent with task Positioning of UE while sleeping: Caregiver independent with task    Home Living Family/patient expects to be discharged to:: Private residence Living Arrangements: Spouse/significant other Available Help at Discharge: Family;Available 24 hours/day Type of Home: House Home Access: Stairs to enter [employer] of Steps: 2 Entrance Stairs-Rails: None Home Layout: Laundry or work area in basement;Able to live on main level with bedroom/bathroom     Bathroom Shower/Tub: Teacher, early years/pre: Handicapped height     Home Equipment: [place] - single point;Wheelchair - power;Wheelchair - Publishing copy (2 wheels);Other (comment)   Additional Comments: has a knee scooter      Prior Functioning/Environment Prior Level of Function : Independent/Modified Independent             Mobility Comments: walks without AD, no falls in past 6 months ADLs Comments: independent        OT Problem List: Decreased strength;Decreased range of motion;Decreased activity tolerance;Impaired balance (sitting and/or standing);Decreased knowledge of use of DME or AE;Decreased knowledge of precautions;Pain;Impaired UE functional use      OT Treatment/Interventions: Self-care/ADL training;Therapeutic exercise;DME and/or AE instruction;Therapeutic activities;Balance training;Patient/family education    OT Goals(Current goals can be found in  the care plan section) Acute Rehab OT Goals Patient Stated Goal:  have less pain OT Goal Formulation: With patient Time For Goal Achievement: 06/01/21 Potential to Achieve Goals: Good  OT Frequency: Min 2X/week    Co-evaluation              AM-PAC OT "6 Clicks" Daily Activity     Outcome Measure Help from another person eating meals?: A Little Help from another person taking care of personal grooming?: A Little Help from another person toileting, which includes using toliet, bedpan, or urinal?: A Lot Help from another person bathing (including washing, rinsing, drying)?: A Lot Help from another person to put on and taking off regular upper body clothing?: A Lot Help from another person to put on and taking off regular lower body clothing?: A Lot 6 Click Score: 14   End of Session Nurse Communication:  (BP)  Activity Tolerance: Patient tolerated treatment well Patient left: in chair;with call bell/phone within reach;with family/visitor present  OT Visit Diagnosis: Pain Pain - Right/Left: Right Pain - part of body: Shoulder                Time: 4235-3614 OT Time Calculation (min): 31 min Charges:  OT General Charges $OT Visit: 1 Visit OT Evaluation $OT Eval Low Complexity: 1 Low OT Treatments $Self Care/Home Management : 8-22 mins  Malosi Hemstreet, OTR/L [place]  Office [phone] Pager: [phone]   [name] 05/18/2021, 11:41 AM

## 2021-05-18 NOTE — Progress Notes (Signed)
Subjective: 2 Days Post-Op Procedure(s) (LRB): Explantation right reverse shoulder arthroplasty; irrigation and debridement, implantation of antibiotic impregnated cement spacer (Right) Patient reports pain as mild. Reports was dizzy yesterday (was hypotensive, received bolus and bp meds were held). Has not been OOB yet today.  Objective: Vital signs in last 24 hours: Temp:  [98.1 F (36.7 C)-99.4 F (37.4 C)] 99.1 F (37.3 C) (02/11 0811) Pulse Rate:  [76-97] 94 (02/11 0811) Resp:  [14-18] 16 (02/11 0521) BP: (95-199)/(55-80) 199/65 (02/11 0811) SpO2:  [89 %-99 %] 89 % (02/11 0811)  Intake/Output from previous day: 02/10 0701 - 02/11 0700 In: 1763.2 [P.O.:840; I.V.:923.2] Out: 1850 [Urine:1850] Intake/Output this shift: Total I/O In: -  Out: 625 [Urine:625]  Recent Labs    05/16/21 1400 05/18/21 0326  HGB 11.0* 8.5*   Recent Labs    05/16/21 1400 05/18/21 0326  WBC 10.1 11.7*  RBC 3.93* 3.11*  HCT 35.0* 28.0*  PLT 564* 406*   Recent Labs    05/16/21 1400 05/18/21 0326  NA 134*  --   K 4.3  --   CL 99  --   CO2 26  --   BUN 11  --   CREATININE 0.86 0.66  GLUCOSE 109*  --   CALCIUM 9.8  --    No results for input(s): LABPT, INR in the last 72 hours.  Neurologically intact ABD soft Neurovascular intact Sensation intact distally Intact pulses distally Dorsiflexion/Plantar flexion intact Incision: dressing C/D/I and no drainage No cellulitis present Compartment soft No sign of DVT Wound vac in place  Assessment/Plan: 2 Days Post-Op Procedure(s) (LRB): Explantation right reverse shoulder arthroplasty; irrigation and debridement, implantation of antibiotic impregnated cement spacer (Right) Advance diet Up with therapy Continue vanco and ceftriaxone per ID, anticipate 6 weeks of abx followed by suppression Following OR cx - no growth at this point Anticipate will need to hold humira in setting of infx  Dizziness yesterday due to hypotension.  Received fluid bolus and meds were held - now hypertensive this morning. To receive antihypertensives today. Watch hgb - 8.5 today down from 11 Per Dr. Onnie Graham expect stay through the weekend and possible D/C Monday.   Levi Turner 05/18/2021, 8:20 AM

## 2021-05-19 MED ORDER — SODIUM CHLORIDE 0.9 % IV SOLN: INTRAVENOUS | Status: DC | PRN

## 2021-05-19 NOTE — Plan of Care (Signed)
°  Problem: Clinical Measurements: Goal: Ability to maintain clinical measurements within normal limits will improve Outcome: Progressing   Problem: Health Behavior/Discharge Planning: Goal: Ability to manage health-related needs will improve Outcome: Progressing   Problem: Clinical Measurements: Goal: Will remain free from infection Outcome: Progressing   Problem: Activity: Goal: Risk for activity intolerance will decrease Outcome: Progressing

## 2021-05-19 NOTE — Progress Notes (Signed)
Occupational Therapy Treatment Patient Details Name: Levi Turner MRN: [id number] DOB: [birth year] Today's Date: 05/19/2021   History of present illness 71 y.o. male admitted with infected R reverse total shoulder arthoplasty. s/p TSA resection, I&D, and placement of wound VAC 05/16/21. PMH includes depression, HTN, R reverse TSA 2013, R TKA.   OT comments  Patient supine in bed in basic sling reporting significant pain in shoulder. Patient continues to have prominent edema throughout RUE from shoulder all the way through forearm with mild edema in hand. Therapist walked patient and spouse through elbow, wrist and had ROM exercises - with patient needing active assist for elbow ROM (tolerating approximately from 30-70 degrees elbow ROM). Therapist applied ice cuff to patient's arm and instructed patient's spouse on how to position cuff and use cooler. Therapist recommended consistent use of ice cuff to reduce edema. Therapist reiterated education on positioning of upper extremity, precautions, and on how to perform UB dressing. They verbalized understanding. Patient has a donjoy ultra sling in room that needs to be donned and patient's wife needs to be instructed on. However, patient in too much pain to tolerate at this time. Will have OT f/u in a.m. Could potentially perform dressing if patient discharging and wound vac switched over to home unit.   Recommendations for follow up therapy are one component of a multi-disciplinary discharge planning process, led by the attending physician.  Recommendations may be updated based on patient status, additional functional criteria and insurance authorization.    Follow Up Recommendations  Follow physician's recommendations for discharge plan and follow up therapies    Assistance Recommended at Discharge    Patient can return home with the following  A little help with walking and/or transfers;A lot of help with bathing/dressing/bathroom;Assistance with  cooking/housework;Help with stairs or ramp for entrance   Equipment Recommendations  None recommended by OT    Recommendations for Other Services      Precautions / Restrictions Precautions Precautions: Shoulder Type of Shoulder Precautions: No specific parameters provided by MD - will expect fully conservative approach with no AROM or PROM Shoulder Interventions: Shoulder sling/immobilizer Precaution Booklet Issued:  (handouts) Required Braces or Orthoses: Sling Restrictions Weight Bearing Restrictions: Yes RUE Weight Bearing: Non weight bearing Other Position/Activity Restrictions: instructed pt in NWB status for RUE        ADL either performed or assessed with clinical judgement   ADL Overall ADL's : Needs assistance/impaired                                       General ADL Comments: Patient reports ambulating to bathroom and being able to perform toilet transfer with assistance of wife. Needed assistance with toileting. THerapist educated patient and wife in regards shoulder precautions, positioning, edema management with ice cuff and answered all questions.    Extremity/Trunk Assessment               Cognition Arousal/Alertness: Awake/alert Behavior During Therapy: WFL for tasks assessed/performed Overall Cognitive Status: Within Functional Limits for tasks assessed                                          Exercises Other Exercises Other Exercises: Patient able to perfomr 10 reps fist pumps, wrist extension, partial supination. Needed active assist to perform elbow flexion/extension (  from approximately 30-70 degrees) due to pain.            Pertinent Vitals/ Pain       Pain Assessment Pain Assessment: 0-10 Pain Score: 8  Pain Location: R shoulder Pain Descriptors / Indicators: Guarding, Sore, Moaning Pain Intervention(s): Limited activity within patient's tolerance   Frequency  Min 2X/week        Progress  Toward Goals  OT Goals(current goals can now be found in the care plan section)  Progress towards OT goals: Progressing toward goals  Acute Rehab OT Goals Patient Stated Goal: have less pain OT Goal Formulation: With patient Time For Goal Achievement: 06/01/21 Potential to Achieve Goals: Good  Plan Discharge plan remains appropriate    Co-evaluation                 AM-PAC OT "6 Clicks" Daily Activity     Outcome Measure   Help from another person eating meals?: A Little Help from another person taking care of personal grooming?: A Little Help from another person toileting, which includes using toliet, bedpan, or urinal?: A Lot Help from another person bathing (including washing, rinsing, drying)?: A Lot Help from another person to put on and taking off regular upper body clothing?: A Lot Help from another person to put on and taking off regular lower body clothing?: A Lot 6 Click Score: 14    End of Session    OT Visit Diagnosis: Pain Pain - Right/Left: Right Pain - part of body: Shoulder   Activity Tolerance Patient limited by pain   Patient Left in bed;with call bell/phone within reach;with family/visitor present   Nurse Communication  (okay to see)        Time: 6967-8938 OT Time Calculation (min): 25 min  Charges: OT General Charges $OT Visit: 1 Visit OT Treatments $Self Care/Home Management : 8-22 mins $Therapeutic Exercise: 8-22 mins  Derl Barrow, OTR/L Acute Care Rehab Services  Office [phone] Pager: [phone]   [name] 05/19/2021, 2:01 PM

## 2021-05-19 NOTE — Progress Notes (Signed)
° °  Subjective: 3 Days Post-Op Procedure(s) (LRB): Explantation right reverse shoulder arthroplasty; irrigation and debridement, implantation of antibiotic impregnated cement spacer (Right)  Right shoulder with mild pain to superior shoulder today Otherwise pt doing well Denies any new symptoms or issues Plan for hopeful d/c tomorrow Patient reports pain as mild.  Objective:   VITALS:   Vitals:   05/18/21 2227 05/19/21 0604  BP: (!) 157/65 (!) 193/78  Pulse: 89 94  Resp: 17 17  Temp: 98.9 F (37.2 C) 99.8 F (37.7 C)  SpO2: 94% 94%    Dressing and wound vac in place Ice machine and sling in place Nv intact distally No rashes, edema or drainage   LABS Recent Labs    05/16/21 1400 05/18/21 0326  HGB 11.0* 8.5*  HCT 35.0* 28.0*  WBC 10.1 11.7*  PLT 564* 406*    Recent Labs    05/16/21 1400 05/18/21 0326  NA 134*  --   K 4.3  --   BUN 11  --   CREATININE 0.86 0.66  GLUCOSE 109*  --      Assessment/Plan: 3 Days Post-Op Procedure(s) (LRB): Explantation right reverse shoulder arthroplasty; irrigation and debridement, implantation of antibiotic impregnated cement spacer (Right) Continue IV antibiotics Hopeful d/c tomorrow  Continue PT/OT    Merla Riches PA-C, MPAS [facility] is now [facility]   [place] [address] [place], [place], [place] [address] Phone: [phone] [email or web address]   [employer]

## 2021-05-19 NOTE — Progress Notes (Signed)
Pharmacy Antibiotic Note  Levi Turner is a 71 y.o. male admitted on 05/16/2021 with a planned exploration, irrigation and debridement with removal of the loose and infected shoulder implants.  Pharmacy has been consulted to dose vancomycin for right prosthetic shoulder infection  Plan: Continue vancomycin 1750 mg iv q 24 hours SCr in AM CTX 2gm IV q24h per MD Follow up renal function, cultures and clinical course  Height: 6' (182.9 cm) Weight: 70 kg (154 lb 6.4 oz) IBW/kg (Calculated) : 77.6  Temp (24hrs), Avg:99.1 F (37.3 C), Min:98.7 F (37.1 C), Max:99.8 F (37.7 C)  Recent Labs  Lab 05/16/21 1400 05/18/21 0326  WBC 10.1 11.7*  CREATININE 0.86 0.66     Estimated Creatinine Clearance: 82.6 mL/min (by C-G formula based on SCr of 0.66 mg/dL).    Allergies  Allergen Reactions   Lisinopril     Pt states it gave him angio edema   Thank you for allowing pharmacy to be a part of this patients care.  Ulice Dash, PharmD 05/19/2021, 11:55 AM

## 2021-05-19 NOTE — Progress Notes (Signed)
Physical Therapy Treatment Patient Details Name: Levi Turner MRN: [id number] DOB: 05/27/1950 Today's Date: 05/19/2021   History of Present Illness 71 y.o. male admitted with infected R reverse total shoulder arthoplasty. s/p TSA resection, I&D, and placement of wound VAC 05/16/21. PMH includes depression, HTN, R reverse TSA 2013, R TKA.    PT Comments    Pt very cooperative and up to ambulate in hall with min assist and no c/o dizziness.  Pt with BP at rest 174/75 (pt states this is close to his norm) but elevated with activity to 201/139; to 184/94 after return to sitting - RN aware.  Recommendations for follow up therapy are one component of a multi-disciplinary discharge planning process, led by the attending physician.  Recommendations may be updated based on patient status, additional functional criteria and insurance authorization.  Follow Up Recommendations  Follow physician's recommendations for discharge plan and follow up therapies     Assistance Recommended at Discharge Intermittent Supervision/Assistance  Patient can return home with the following A little help with bathing/dressing/bathroom;Assist for transportation;Help with stairs or ramp for entrance   Equipment Recommendations  None recommended by PT    Recommendations for Other Services       Precautions / Restrictions Precautions Precautions: Shoulder Type of Shoulder Precautions: No specific parameters provided by MD - will expect fully conservative approach with no AROM or PROM Shoulder Interventions: Shoulder sling/immobilizer Required Braces or Orthoses: Sling Restrictions Weight Bearing Restrictions: Yes RUE Weight Bearing: Non weight bearing     Mobility  Bed Mobility Overal bed mobility: Needs Assistance Bed Mobility: Supine to Sit Rolling: Min assist         General bed mobility comments: increased time with min assist to bring trunk to upright sitting    Transfers Overall transfer  level: Needs assistance Equipment used: Rolling walker (2 wheels) Transfers: Sit to/from Stand Sit to Stand: Min guard           General transfer comment: steady assist only    Ambulation/Gait Ambulation/Gait assistance: Min guard Gait Distance (Feet): 100 Feet Assistive device: Rolling walker (2 wheels) Gait Pattern/deviations: Step-through pattern, Shuffle, Trunk flexed Gait velocity: decr     General Gait Details: Pt with one hand on RW to steady only - no c/o dizziness this date   Stairs             Wheelchair Mobility    Modified Rankin (Stroke Patients Only)       Balance Overall balance assessment: Needs assistance Sitting-balance support: No upper extremity supported, Feet supported Sitting balance-Leahy Scale: Good       Standing balance-Leahy Scale: Fair                              Cognition Arousal/Alertness: Awake/alert Behavior During Therapy: WFL for tasks assessed/performed Overall Cognitive Status: Within Functional Limits for tasks assessed                                          Exercises      General Comments        Pertinent Vitals/Pain Pain Assessment Pain Assessment: 0-10 Pain Score: 4  Pain Location: R shoulder - with any movement Pain Descriptors / Indicators: Guarding, Sore Pain Intervention(s): Limited activity within patient's tolerance, Monitored during session, Premedicated before session    Home Living  Prior Function            PT Goals (current goals can now be found in the care plan section) Acute Rehab PT Goals Patient Stated Goal: gardening, yardwork PT Goal Formulation: With patient Time For Goal Achievement: 05/31/21 Potential to Achieve Goals: Good Progress towards PT goals: Progressing toward goals    Frequency    Min 3X/week      PT Plan Current plan remains appropriate    Co-evaluation              AM-PAC PT  "6 Clicks" Mobility   Outcome Measure  Help needed turning from your back to your side while in a flat bed without using bedrails?: A Little Help needed moving from lying on your back to sitting on the side of a flat bed without using bedrails?: A Little Help needed moving to and from a bed to a chair (including a wheelchair)?: A Little Help needed standing up from a chair using your arms (e.g., wheelchair or bedside chair)?: A Little Help needed to walk in hospital room?: A Little Help needed climbing 3-5 steps with a railing? : A Lot 6 Click Score: 17    End of Session Equipment Utilized During Treatment: Gait belt Activity Tolerance: Patient tolerated treatment well;Other (comment) (elevated BP) Patient left: in chair;with call bell/phone within reach;with family/visitor present Nurse Communication: Mobility status;Other (comment) (BP) PT Visit Diagnosis: Unsteadiness on feet (R26.81);Difficulty in walking, not elsewhere classified (R26.2);Pain Pain - Right/Left: Right Pain - part of body: Shoulder     Time: 8250-5397 PT Time Calculation (min) (ACUTE ONLY): 29 min  Charges:  $Gait Training: 23-37 mins                     [place] Pager [phone] Office [phone]    Charae Depaolis 05/19/2021, 12:54 PM

## 2021-05-20 DIAGNOSIS — Z4731 Aftercare following explantation of shoulder joint prosthesis: Secondary | ICD-10-CM | POA: Diagnosis not present

## 2021-05-20 LAB — CREATININE, SERUM
Creatinine, Ser: 0.72 mg/dL (ref 0.61–1.24)
GFR, Estimated: >60 mL/min (ref >60)

## 2021-05-20 MED ORDER — DOXYCYCLINE HYCLATE 100 MG PO TABS
100.0000 mg | ORAL_TABLET | Freq: Two times a day (BID) | ORAL | Status: DC
  Administered 2021-05-20: 100 mg via ORAL
  Filled 2021-05-20: qty 1

## 2021-05-20 MED ORDER — HEPARIN SOD (PORK) LOCK FLUSH 100 UNIT/ML IV SOLN
250.0000 [IU] | INTRAVENOUS | Status: AC | PRN
  Administered 2021-05-20: 250 [IU]
  Filled 2021-05-20: qty 2.5

## 2021-05-20 MED ORDER — OXYCODONE-ACETAMINOPHEN 5-325 MG PO TABS: 1.0000 | ORAL_TABLET | ORAL | 0 refills | Status: AC | PRN

## 2021-05-20 MED ORDER — DOXYCYCLINE HYCLATE 100 MG PO TABS: 100.0000 mg | ORAL_TABLET | Freq: Two times a day (BID) | ORAL | 0 refills | Status: DC

## 2021-05-20 MED ORDER — CEFTRIAXONE IV (FOR PTA / DISCHARGE USE ONLY): 2.0000 g | INTRAVENOUS | 0 refills | Status: DC

## 2021-05-20 NOTE — Progress Notes (Signed)
PHARMACY CONSULT NOTE FOR:  OUTPATIENT  PARENTERAL ANTIBIOTIC THERAPY (OPAT)  Indication: R shoulder PJI Regimen: Ceftriaxone 2g Q24H and Doxycycline 100 mg po bid.  End date: March 23rd, 2023  IV antibiotic discharge orders are pended. To discharging provider:  please sign these orders via discharge navigator,  Select New Orders & click on the button choice - Manage This Unsigned Work.     Thank you for allowing pharmacy to be a part of this patient's care.  Elpidio Anis, PharmD Candidate [phone] 05/20/2021, 11:36 AM

## 2021-05-20 NOTE — Progress Notes (Signed)
Switched to prevena wound VAC. IV rocephin given.

## 2021-05-20 NOTE — Discharge Instructions (Signed)
° °  Metta Clines. Supple, M.D., F.A.A.O.S. Orthopaedic Surgery Specializing in Arthroscopic and Reconstructive Surgery of the Shoulder [phone] [address] [place], [place] [address] - Fax [phone]   POST-OP TOTAL SHOULDER REPLACEMENT INSTRUCTIONS  1. We will call you for follow up appt, it will be monday  2. Keep shoulder dressing and vac dry   3. Wear your sling/immobilizer at all times except to perform the exercises below or to occasionally let your arm dangle by your side to stretch your elbow.   4. Range of motion to your elbow, wrist, and hand are encouraged 3-5 times daily. Exercise to your hand and fingers helps to reduce swelling you may experience.   5. Prescriptions for a pain medication and a muscle relaxant are provided for you. It is recommended that if you are experiencing pain that you pain medication alone is not controlling, add the muscle relaxant along with the pain medication which can give additional pain relief. The first 1-2 days is generally the most severe of your pain and then should gradually decrease. As your pain lessens it is recommended that you decrease your use of the pain medications to an "as needed basis'" only and to always comply with the recommended dosages of the pain medications.  6. Pain medications can produce constipation along with their use. If you experience this, the use of an over the counter stool softener or laxative daily is recommended.   7. For additional questions or concerns, please do not hesitate to call the office. If after hours there is an answering service to forward your concerns to the physician on call.  8.Pain control following an exparel block  To help control your post-operative pain you received a nerve block  performed with Exparel which is a long acting anesthetic (numbing agent) which can provide pain relief and sensations of numbness (and relief of pain) in the operative shoulder and arm for up to 3  days. Sometimes it provides mixed relief, meaning you may still have numbness in certain areas of the arm but can still be able to move  parts of that arm, hand, and fingers. We recommend that your prescribed pain medications  be used as needed. We do not feel it is necessary to "pre medicate" and "stay ahead" of pain.  Taking narcotic pain medications when you are not having any pain can lead to unnecessary and potentially dangerous side effects.    9. Use the ice machine as much as possible in the first 5-7 days from surgery, then you can wean its use to as needed. The ice typically needs to be replaced every 6 hours, instead of ice you can actually freeze water bottles to put in the cooler and then fill water around them to avoid having to purchase ice. You can have spare water bottles freezing to allow you to rotate them once they have melted. Try to have a thin shirt or light cloth or towel under the ice wrap to protect your skin.   FOR ADDITIONAL INFO ON ICE MACHINE AND INSTRUCTIONS GO TO THE WEBSITE AT  [email or web address]  k.     POST-OP EXERCISES  Move your hand for edema control as instructed by the therapists. No exercises to the shoulder yet. OK to allow arm to dangle to perform hygiene and dress otherwise continue with sling

## 2021-05-20 NOTE — TOC Transition Note (Signed)
Transition of Care [facility]) - CM/SW Discharge Note  Patient Details  Name: Levi Turner MRN: [id number] Date of Birth: [birth year]  [facility] CM/SW Contact:  Sherie Don, LCSW Phone Number: 05/20/2021, 1:17 PM  Clinical Narrative: [name] has OPAT orders. CSW updated patient's wife that everything has been set up. TOC signing off.  Final next level of care: [place] Barriers to Discharge: Barriers Resolved  Patient Goals and CMS Choice Patient states their goals for this hospitalization and ongoing recovery are:: Discharge home with IV antibiotics and HHRN if needed CMS Medicare.gov Compare Post Acute Care list provided to:: Patient Represenative (must comment) Choice offered to / list presented to : Spouse  Discharge Plan and Services In-house Referral: Clinical Social Work Post Acute Care Choice: Home Health          DME Arranged: N/A DME Agency: NA HH Arranged: RN [place] Agency: [place] Date [facility] Agency Contacted: 05/17/21 Representative spoke with at [place]: [place]  Readmission Risk Interventions No flowsheet data found.

## 2021-05-20 NOTE — Plan of Care (Signed)
  Problem: Coping: Goal: Level of anxiety will decrease Outcome: Progressing   Problem: Pain Managment: Goal: General experience of comfort will improve Outcome: Progressing   

## 2021-05-20 NOTE — Progress Notes (Signed)
Discharge package printed and instructions given to patient and wife. Verbalize understanding. 

## 2021-05-20 NOTE — Discharge Summary (Signed)
PATIENT ID:      Levi Turner  MRN:     [id number] DOB/AGE:    1950-06-07 / 71 y.o.     DISCHARGE SUMMARY  ADMISSION DATE:    05/16/2021 DISCHARGE DATE:    ADMISSION DIAGNOSIS: Infected Right Reverse shoulder arthroplasty Past Medical History:  Diagnosis Date   Anemia    takes Ferrous Sulfate daily   Arthritis    shoulders and knees   Complication of anesthesia ~ 2000   "HARD TO WAKE UP" after first colonscopy   Depression    just b/c out of work d/t pain in shoulder   Difficulty sleeping    takes Melatonin nightly and pain pill   Glaucoma    Hyperlipidemia    takes Simvastatin nightly   Hypertension    takes Lisinopril and Amlodipine daily   Joint pain    Joint swelling    Shoulder pain    RIGHT   Ulcerative colitis (Standard)    recieves IV Remicade for treatment;Mercaptopurine and Lialda daily    DISCHARGE DIAGNOSIS:   Principal Problem:   Aftercare following explantation of shoulder joint prosthesis   PROCEDURE: Procedure(s): Explantation right reverse shoulder arthroplasty; irrigation and debridement, implantation of antibiotic impregnated cement spacer on 05/16/2021  CONSULTS:    HISTORY:  See H&P in chart.  HOSPITAL COURSE:  Levi Turner is a 71 y.o. admitted on 05/16/2021 with a diagnosis of Infected Right Reverse shoulder arthroplasty.  They were brought to the operating room on 05/16/2021 and underwent Procedure(s): Explantation right reverse shoulder arthroplasty; irrigation and debridement, implantation of antibiotic impregnated cement spacer.    They were given perioperative antibiotics:  Anti-infectives (From admission, onward)    Start     Dose/Rate Route Frequency Ordered Stop   05/20/21 1215  doxycycline (VIBRA-TABS) tablet 100 mg        100 mg Oral Every 12 hours 05/20/21 1128     05/17/21 1800  cefTRIAXone (ROCEPHIN) 2 g in sodium chloride 0.9 % 100 mL IVPB        2 g 200 mL/hr over 30 Minutes Intravenous Every 24 hours 05/16/21 2151      05/17/21 1200  vancomycin (VANCOREADY) IVPB 1750 mg/350 mL  Status:  Discontinued        1,750 mg 175 mL/hr over 120 Minutes Intravenous Every 24 hours 05/16/21 2222 05/20/21 1128   05/16/21 1748  vancomycin (VANCOCIN) powder  Status:  Discontinued          As needed 05/16/21 1748 05/16/21 2125   05/16/21 1601  vancomycin (VANCOCIN) 1-5 GM/200ML-% IVPB       Note to Pharmacy: Nash Dimmer W: cabinet override      05/16/21 1601 05/17/21 0414   05/16/21 1600  sodium chloride 0.9 % with cefTRIAXone (ROCEPHIN) ADS Med       Note to Pharmacy: Nash Dimmer W: cabinet override      05/16/21 1600 05/17/21 0414     .  Patient underwent the above named procedure and tolerated it well.  We consulted infectious disease to help with management of his right shoulder infection.  While he did experience postoperative anemia that did not require transfusion.  His hemoglobin postoperatively dropped from 11-8.5.  He was kept through the weekend for PICC line placement and IV antibiotics and to allow the cultures to more mature.  Infectious disease provided appropriate IV therapy and on today's date 05/20/2021 patient was stable and home arrangements were made for ongoing IV antibiotics.  He  did have significant postoperative swelling in his right shoulder and upper extremity and the wound VAC that was placed intraoperatively had very minimal drainage.  This was transferred over to the home [place] wound VAC and we will closely monitor and make plans for removal on next Monday.  While he has significant swelling he remained neurovascular intact to his upper extremity.  His compartments remain soft.  This time he was stable for discharge to home and to follow-up closely in the office for ongoing management.  We appreciate infectious disease help with this patient.    DIAGNOSTIC STUDIES:  RECENT RADIOGRAPHIC STUDIES :  NM Bone Scan 3 Phase  Result Date: 05/12/2021 CLINICAL DATA:  Right shoulder pain. Status post  right shoulder arthroplasty 10 years ago. EXAM: NUCLEAR MEDICINE 3-PHASE BONE SCAN TECHNIQUE: Radionuclide angiographic images, immediate static blood pool images, and 3-hour delayed static images were obtained of the chest including the shoulders after intravenous injection of radiopharmaceutical. RADIOPHARMACEUTICALS:  Twenty mCi Tc-54m MDP IV COMPARISON:  None recent FINDINGS: Vascular phase: There is no abnormal asymmetric increased blood flow to the right shoulder. Blood pool phase: On the blood pool phase portion of the examination there is asymmetric increased radiotracer uptake surrounding the right shoulder and proximal and mid right humerus. Delayed phase: On the delayed phase images there is asymmetric increased uptake surrounding the entire humeral component of the right shoulder arthroplasty device as well as the glenoid component. There is also asymmetric increased uptake localizing to the right acromion IMPRESSION: 1. No signs to suggest right shoulder arthroplasty device infection. 2. There is asymmetric increased uptake on the blood pool and delayed phase images surrounding the humeral and glenoid components of the right shoulder arthroplasty device. The imaging findings may reflect underlying loosening and/or bony remodeling. Correlation with plain film radiographs advised. Electronically Signed   By: Kerby Moors M.D.   On: 05/12/2021 08:59   DG Humerus Right  Result Date: 05/16/2021 CLINICAL DATA:  Status post removal of right shoulder infected reverse arthroplasty with I and D. EXAM: RIGHT HUMERUS - 2+ VIEW COMPARISON:  05/15/2011 FINDINGS: Fracture of the mid humeral shaft, distal to the proximal humerus prosthesis. Displacement of distal fracture fragment medially. Degenerative changes about the acromioclavicular joint. IMPRESSION: Postsurgical changes, as detailed above. Electronically Signed   By: Abigail Miyamoto M.D.   On: 05/16/2021 20:53   [place] EKG SITE RITE  Result Date: 05/16/2021 If  Site Rite image not attached, placement could not be confirmed due to current cardiac rhythm.   RECENT VITAL SIGNS:  Patient Vitals for the past 24 hrs:  BP Temp Temp src Pulse Resp SpO2  05/20/21 0454 (!) 176/76 99.2 F (37.3 C) -- 96 16 95 %  05/19/21 2110 (!) 155/78 97.6 F (36.4 C) Oral 100 17 96 %  05/19/21 1524 (!) 159/66 97.6 F (36.4 C) Oral 96 16 95 %  05/19/21 1445 (!) 155/76 98.7 F (37.1 C) Oral 94 17 --  .  RECENT EKG RESULTS:    Orders placed or performed during the hospital encounter of 05/16/21   EKG 12 lead per protocol   EKG 12 lead per protocol    DISCHARGE INSTRUCTIONS:    DISCHARGE MEDICATIONS:   Allergies as of 05/20/2021       Reactions   Lisinopril    Pt states it gave him angio edema        Medication List     STOP taking these medications    acetaminophen 500 MG tablet  Commonly known as: TYLENOL   MAGNESIUM CARBONATE PO       TAKE these medications    amLODipine 10 MG tablet Commonly known as: NORVASC Take 10 mg by mouth in the morning.   azaTHIOprine 50 MG tablet Commonly known as: IMURAN Take 100 mg by mouth in the morning.   B-complex with vitamin C tablet Take 2 tablets by mouth every morning.   carvedilol 6.25 MG tablet Commonly known as: COREG Take 6.25 mg by mouth 2 (two) times daily.   cholecalciferol 1000 units tablet Commonly known as: VITAMIN D Take 3,000 Units by mouth in the morning.   diphenhydramine-acetaminophen 25-500 MG Tabs tablet Commonly known as: TYLENOL PM Take 1 tablet by mouth at bedtime.   ferrous sulfate 325 (65 FE) MG EC tablet Take 1,300 mg by mouth in the morning and at bedtime. 3 bid   folic acid 1 MG tablet Commonly known as: FOLVITE Take 1 mg by mouth in the morning.   Humira 40 MG/0.4ML Pskt Generic drug: Adalimumab Inject into the skin every 14 (fourteen) days.   multivitamin with minerals Tabs tablet Take 2 tablets by mouth every morning.   oxyCODONE-acetaminophen 5-325  MG tablet Commonly known as: Percocet Take 1 tablet by mouth every 4 (four) hours as needed (max 6 q).   potassium gluconate 595 (99 K) MG Tabs tablet Take 1,190 mg by mouth in the morning.   pravastatin 80 MG tablet Commonly known as: PRAVACHOL Take 80 mg by mouth in the morning.   SAW PALMETTO PO Take 320 mg by mouth every morning.   sulfaSALAzine 500 MG tablet Commonly known as: AZULFIDINE Take 1,000 mg by mouth 2 (two) times daily.   tamsulosin 0.4 MG Caps capsule Commonly known as: FLOMAX Take 0.8 mg by mouth in the morning.   telmisartan 80 MG tablet Commonly known as: MICARDIS Take 80 mg by mouth in the morning.   traMADol 50 MG tablet Commonly known as: ULTRAM Take 50 mg by mouth every 6 (six) hours as needed for pain.   TURMERIC-GINGER PO Take 2 capsules by mouth in the morning and at bedtime.   vitamin B-12 500 MCG tablet Commonly known as: CYANOCOBALAMIN Take 500 mcg by mouth in the morning.        FOLLOW UP VISIT:    Follow-up Information     [facility] Follow up.   Why: [facility] will provide home IV antibiotics.        [facility] Follow up.   Specialty: Home Health Services Why: Alvis Lemmings will provide nursing for PICC line maintenance and labs. Contact information: [place] [address] [phone]         Justice Britain, MD Follow up.   Specialty: Orthopedic Surgery Why: Monday 05/27/21 at 11:00 Contact information: [address] [place] [address] [phone]                 DISCHARGE TO: Home   DISCHARGE CONDITION:  Thereasa Parkin Danuta Huseman for Dr. Justice Britain 05/20/2021, 11:53 AM

## 2021-05-20 NOTE — Progress Notes (Signed)
Occupational Therapy Treatment Patient Details Name: Levi Turner MRN: [id number] DOB: 10/16/1950 Today's Date: 05/20/2021   History of present illness 71 y.o. male admitted with infected R reverse total shoulder arthoplasty. s/p TSA resection, I&D, and placement of wound VAC 05/16/21. PMH includes depression, HTN, R reverse TSA 2013, R TKA.   OT comments  Patient was noted to have continued increased pain in R shoulder with thought of participation in tasks on this date. Patient and wife were educated on how to don/doff don joy sling with wife able to verbalize understanding. Patient and wife were re educated on dressing, ice machine, and shoulder precautions. Patient and wife verbalized understanding. Patient would continue to benefit from skilled OT services at this time while admitted and after d/c to address noted deficits in order to improve overall safety and independence in ADLs.     Recommendations for follow up therapy are one component of a multi-disciplinary discharge planning process, led by the attending physician.  Recommendations may be updated based on patient status, additional functional criteria and insurance authorization.    Follow Up Recommendations  Follow physician's recommendations for discharge plan and follow up therapies    Assistance Recommended at Discharge Frequent or constant Supervision/Assistance  Patient can return home with the following  A little help with walking and/or transfers;A lot of help with bathing/dressing/bathroom;Assistance with cooking/housework;Help with stairs or ramp for entrance   Equipment Recommendations  None recommended by OT    Recommendations for Other Services      Precautions / Restrictions Precautions Precautions: Shoulder Type of Shoulder Precautions: No specific parameters provided by MD - will expect fully conservative approach with no AROM or PROM Shoulder Interventions: Shoulder sling/immobilizer;Off for  dressing/bathing/exercises Precaution Booklet Issued:  (patient's wife has copy from previous sessions) Required Braces or Orthoses: Sling Restrictions Weight Bearing Restrictions: Yes RUE Weight Bearing: Non weight bearing Other Position/Activity Restrictions: instructed pt in NWB status for RUE       Mobility Bed Mobility                    Transfers                         Balance                                           ADL either performed or assessed with clinical judgement   ADL Overall ADL's : Needs assistance/impaired                 Upper Body Dressing : Maximal assistance;Sitting Upper Body Dressing Details (indicate cue type and reason): patient and wife were educated on how to don/doff don joy sling with max visual and verbal cues. patient's wife verbalized understanding. dressing simulation was completed on this date with nurse to change dressing after session prior to d.c. patients wife verabzlied understanding.                        Extremity/Trunk Assessment              Vision       Perception     Praxis      Cognition Arousal/Alertness: Awake/alert Behavior During Therapy: WFL for tasks assessed/performed Overall Cognitive Status: Within Functional Limits for tasks assessed  Exercises      Shoulder Instructions Shoulder Instructions Donning/doffing shirt without moving shoulder: Caregiver independent with task Method for sponge bathing under operated UE: Caregiver independent with task Donning/doffing sling/immobilizer: Caregiver independent with task Correct positioning of sling/immobilizer: Caregiver independent with task Sling wearing schedule (on at all times/off for ADL's): Caregiver independent with task Proper positioning of operated UE when showering: Caregiver independent with task Positioning of UE while sleeping:  Caregiver independent with task     General Comments      Pertinent Vitals/ Pain       Pain Assessment Pain Assessment: Faces Faces Pain Scale: Hurts even more Pain Location: R shoulder Pain Descriptors / Indicators: Guarding, Sore Pain Intervention(s): Limited activity within patient's tolerance, Monitored during session, Premedicated before session  Home Living                                          Prior Functioning/Environment              Frequency  Min 2X/week        Progress Toward Goals  OT Goals(current goals can now be found in the care plan section)  Progress towards OT goals: Progressing toward goals     Plan Discharge plan remains appropriate    Co-evaluation                 AM-PAC OT "6 Clicks" Daily Activity     Outcome Measure   Help from another person eating meals?: A Little Help from another person taking care of personal grooming?: A Little Help from another person toileting, which includes using toliet, bedpan, or urinal?: A Lot Help from another person bathing (including washing, rinsing, drying)?: A Lot Help from another person to put on and taking off regular upper body clothing?: A Lot Help from another person to put on and taking off regular lower body clothing?: A Lot 6 Click Score: 14    End of Session    OT Visit Diagnosis: Pain Pain - Right/Left: Right Pain - part of body: Shoulder   Activity Tolerance Patient limited by pain   Patient Left in bed;with call bell/phone within reach;with family/visitor present;with nursing/sitter in room   Nurse Communication Other (comment) (nurse cleared patient for session)        Time: 1129-1205 OT Time Calculation (min): 36 min  Charges: OT General Charges $OT Visit: 1 Visit OT Treatments $Self Care/Home Management : 23-37 mins  Jackelyn Poling OTR/L, MS Acute Rehabilitation Department Office# [phone] Pager# [phone]   Marcellina Millin 05/20/2021, 12:38 PM

## 2021-05-20 NOTE — Plan of Care (Signed)
  Problem: Pain Managment: Goal: General experience of comfort will improve Outcome: Progressing   Problem: Safety: Goal: Ability to remain free from injury will improve Outcome: Progressing   

## 2021-05-20 NOTE — Progress Notes (Signed)
Physical Therapy Treatment Patient Details Name: Levi Turner MRN: [id number] DOB: 09/17/1950 Today's Date: 05/20/2021   History of Present Illness 71 y.o. male admitted with infected R reverse total shoulder arthoplasty. s/p TSA resection, I&D, and placement of wound VAC 05/16/21. PMH includes depression, HTN, R reverse TSA 2013, R TKA.    PT Comments    Pt progressing well this session. Ready for d/c with family assist as needed from PT standpoint.    Recommendations for follow up therapy are one component of a multi-disciplinary discharge planning process, led by the attending physician.  Recommendations may be updated based on patient status, additional functional criteria and insurance authorization.  Follow Up Recommendations  Follow physician's recommendations for discharge plan and follow up therapies     Assistance Recommended at Discharge Intermittent Supervision/Assistance  Patient can return home with the following A little help with bathing/dressing/bathroom;Assist for transportation;Help with stairs or ramp for entrance   Equipment Recommendations  None recommended by PT    Recommendations for Other Services       Precautions / Restrictions Precautions Precautions: Shoulder Type of Shoulder Precautions: No specific parameters provided by MD - will expect fully conservative approach with no AROM or PROM Shoulder Interventions: Shoulder sling/immobilizer Required Braces or Orthoses: Sling Restrictions Weight Bearing Restrictions: Yes RUE Weight Bearing: Non weight bearing     Mobility  Bed Mobility   Bed Mobility: Supine to Sit, Sit to Supine     Supine to sit: Min guard, HOB elevated (35 degrees) Sit to supine: Min assist   General bed mobility comments: increased time with min/guard  to bring trunk to upright; min assist to lift LEs on to bed    Transfers Overall transfer level: Needs assistance Equipment used: None Transfers: Sit to/from  Stand Sit to Stand: Supervision, Min guard           General transfer comment: for safety    Ambulation/Gait Ambulation/Gait assistance: Min guard, Supervision Gait Distance (Feet): 140 Feet Assistive device: None Gait Pattern/deviations: Step-through pattern, Decreased stride length, Narrow base of support Gait velocity: decr     General Gait Details: no dizziness, min/guard to supervision for safety, initial steadying assist; encouraged pt to allow L UE swing to improved gait stability   Stairs Stairs: Yes Stairs assistance: Min guard Stair Management: No rails, Forwards, Step to pattern Number of Stairs: 2 General stair comments: cues for step to for safety, spouse present for stair training and able to provide min/guard as needed   Wheelchair Mobility    Modified Rankin (Stroke Patients Only)       Balance           Standing balance support: No upper extremity supported Standing balance-Leahy Scale: Fair                              Cognition Arousal/Alertness: Awake/alert Behavior During Therapy: WFL for tasks assessed/performed Overall Cognitive Status: Within Functional Limits for tasks assessed                                          Exercises      General Comments        Pertinent Vitals/Pain Pain Assessment Pain Assessment: Faces Faces Pain Scale: Hurts even more Pain Location: R shoulder Pain Descriptors / Indicators: Guarding, Sore Pain Intervention(s): Limited activity within  patient's tolerance, Monitored during session, Ice applied    Home Living                          Prior Function            PT Goals (current goals can now be found in the care plan section) Acute Rehab PT Goals Patient Stated Goal: gardening, yardwork PT Goal Formulation: With patient Time For Goal Achievement: 05/31/21 Potential to Achieve Goals: Good Progress towards PT goals: Progressing toward goals     Frequency    Min 3X/week      PT Plan Current plan remains appropriate    Co-evaluation              AM-PAC PT "6 Clicks" Mobility   Outcome Measure  Help needed turning from your back to your side while in a flat bed without using bedrails?: A Little Help needed moving from lying on your back to sitting on the side of a flat bed without using bedrails?: A Little Help needed moving to and from a bed to a chair (including a wheelchair)?: A Little Help needed standing up from a chair using your arms (e.g., wheelchair or bedside chair)?: A Little Help needed to walk in hospital room?: A Little Help needed climbing 3-5 steps with a railing? : A Little 6 Click Score: 18    End of Session Equipment Utilized During Treatment: Gait belt Activity Tolerance: Patient tolerated treatment well Patient left: in bed;with call bell/phone within reach;with bed alarm set   PT Visit Diagnosis: Unsteadiness on feet (R26.81);Difficulty in walking, not elsewhere classified (R26.2);Pain Pain - Right/Left: Right Pain - part of body: Shoulder     Time: 7673-4193 PT Time Calculation (min) (ACUTE ONLY): 22 min  Charges:  $Gait Training: 8-22 mins                     Baxter Flattery, PT  Acute Rehab Dept ([place]) [phone] Pager [phone]  05/20/2021    [facility] 05/20/2021, 10:27 AM

## 2021-05-20 NOTE — Progress Notes (Signed)
Levi Turner  MRN: [id number] DOB/Age: 1951/03/09 71 y.o. [facility] Procedure: Procedure(s) (LRB): Explantation right reverse shoulder arthroplasty; irrigation and debridement, implantation of antibiotic impregnated cement spacer (Right)     Subjective: Anxious to go home , still requiring oxycodone for pain control . Drain with no output in wall unit but still functioning  Vital Signs Temp:  [97.6 F (36.4 C)-99.2 F (37.3 C)] 99.2 F (37.3 C) (02/13 0454) Pulse Rate:  [92-100] 96 (02/13 0454) Resp:  [16-17] 16 (02/13 0454) BP: (155-176)/(66-93) 176/76 (02/13 0454) SpO2:  [95 %-96 %] 95 % (02/13 0454)  Lab Results Recent Labs    05/18/21 0326  WBC 11.7*  HGB 8.5*  HCT 28.0*  PLT 406*   BMET Recent Labs    05/18/21 0326 05/20/21 0548  CREATININE 0.66 0.72   INR  Date Value Ref Range Status  09/10/2011 0.96 0.00 - 1.49 Final     Exam Still with significant swelling about the right shoulder and upper arm drifting distally through the elbow and forearm. All compartments soft and NVI. Vac still functioning        Plan Plans are to go home today after ID and Pharmacy sets ABX dosing Cultures still negative as of this am which is not surprising given suspected organism is C. Acnes and is slow growing I have discussed plan with pt to go home with preveena home vac and 6 wks IV antibiotics after ID approves and we will see him in office  Monday to perform vac removal and wound check.   Levi Kleckley PA-C  05/20/2021, 8:06 AM Contact # [phone]

## 2021-05-20 NOTE — Progress Notes (Signed)
[facility] for Infectious Disease   Reason for visit: Follow up on prosthetic joint infection of right shoulder  Interval History: no positive culture growth from operative cultures; remains afebrile.  Wife at bedside.    Day 5 total antibiotics  Physical Exam: Constitutional:  Vitals:   05/19/21 2110 05/20/21 0454  BP: (!) 155/78 (!) 176/76  Pulse: 100 96  Resp: 17 16  Temp: 97.6 F (36.4 C) 99.2 F (37.3 C)  SpO2: 96% 95%   patient appears in NAD Respiratory: Normal respiratory effort; CTA B Cardiovascular: RRR MS: shoulder in sling  Review of Systems: Constitutional: negative for fevers and chills Gastrointestinal: negative for nausea and diarrhea  Lab Results  Component Value Date   WBC 11.7 (H) 05/18/2021   HGB 8.5 (L) 05/18/2021   HCT 28.0 (L) 05/18/2021   MCV 90.0 05/18/2021   PLT 406 (H) 05/18/2021    Lab Results  Component Value Date   CREATININE 0.72 05/20/2021   BUN 11 05/16/2021   NA 134 (L) 05/16/2021   K 4.3 05/16/2021   CL 99 05/16/2021   CO2 26 05/16/2021    Lab Results  Component Value Date   ALT 20 09/10/2011   AST 14 09/10/2011   ALKPHOS 54 09/10/2011     Microbiology: Recent Results (from the past 240 hour(s))  Surgical PCR Screen     Status: None   Collection Time: 05/16/21  1:44 PM   Specimen: Nasal Mucosa; Nasal Swab  Result Value Ref Range Status   MRSA, PCR NEGATIVE NEGATIVE Final   Staphylococcus aureus NEGATIVE NEGATIVE Final    Comment: (NOTE) The Xpert SA Assay (FDA approved for NASAL specimens in patients 87 years of age and older), is one component of a comprehensive surveillance program. It is not intended to diagnose infection nor to guide or monitor treatment. Performed at [facility], [place] [address], [place], [place] [address]   SARS Coronavirus 2 by RT PCR (hospital order, performed in [facility] hospital lab) Nasopharyngeal Nasopharyngeal Swab     Status: None   Collection Time:  05/16/21  1:44 PM   Specimen: Nasopharyngeal Swab  Result Value Ref Range Status   SARS Coronavirus 2 NEGATIVE NEGATIVE Final    Comment: (NOTE) SARS-CoV-2 target nucleic acids are NOT DETECTED.  The SARS-CoV-2 RNA is generally detectable in upper and lower respiratory specimens during the acute phase of infection. The lowest concentration of SARS-CoV-2 viral copies this assay can detect is 250 copies / mL. A negative result does not preclude SARS-CoV-2 infection and should not be used as the sole basis for treatment or other patient management decisions.  A negative result may occur with improper specimen collection / handling, submission of specimen other than nasopharyngeal swab, presence of viral mutation(s) within the areas targeted by this assay, and inadequate number of viral copies (<250 copies / mL). A negative result must be combined with clinical observations, patient history, and epidemiological information.  Fact Sheet for Patients:   StrictlyIdeas.no  Fact Sheet for Healthcare Providers: BankingDealers.co.za  This test is not yet approved or  cleared by the [place] FDA and has been authorized for detection and/or diagnosis of SARS-CoV-2 by FDA under an Emergency Use Authorization (EUA).  This EUA will remain in effect (meaning this test can be used) for the duration of the COVID-19 declaration under Section 564(b)(1) of the Act, 21 U.S.C. section 360bbb-3(b)(1), unless the authorization is terminated or revoked sooner.  Performed at [facility], [place] [address] [place], [place] [address]   Aerobic/Anaerobic Culture w Gram Stain (surgical/deep wound)     Status: None (Preliminary result)   Collection Time: 05/16/21  5:39 PM   Specimen: PATH Soft tissue  Result Value Ref Range Status   Specimen Description   Final    ABSCESS SHOULDER RIGHT SUPERFICIAL Performed at [place] [address] [place], [place] [address]    Special Requests   Final    NONE Performed at [facility], [place] [address], [place], [place] [address]    Gram Stain   Final    NO SQUAMOUS EPITHELIAL CELLS SEEN MODERATE WBC SEEN NO ORGANISMS SEEN    Culture   Final    NO GROWTH 4 DAYS NO ANAEROBES ISOLATED; CULTURE IN PROGRESS FOR 5 DAYS Performed at [facility], [place] [address], [place], [place] [address]    Report Status PENDING  Incomplete  Aerobic/Anaerobic Culture w Gram Stain (surgical/deep wound)     Status: None (Preliminary result)   Collection Time: 05/16/21  5:45 PM   Specimen: PATH Soft tissue  Result Value Ref Range Status   Specimen Description   Final    TISSUE SHOULDER RIGHT JOINT Performed at [facility], [place] [address] [place], [place] [address]    Special Requests   Final    NONE Performed at [facility], [place] [address], [place], [place] [address]    Gram Stain   Final    NO SQUAMOUS EPITHELIAL CELLS SEEN MODERATE WBC SEEN NO ORGANISMS SEEN    Culture   Final    NO GROWTH 4 DAYS NO ANAEROBES ISOLATED; CULTURE IN PROGRESS FOR 5 DAYS Performed at [facility], [place] [address] [place], [place] [address]    Report Status PENDING  Incomplete  Aerobic/Anaerobic Culture w Gram Stain (surgical/deep wound)     Status: None (Preliminary result)   Collection Time: 05/16/21  5:45 PM   Specimen: PATH Soft tissue  Result Value Ref Range Status   Specimen Description   Final    TISSUE SHOULDER RIGHT CAPSULE Performed at [place] 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    Labs weekly while on IV antibiotics: _x_ CBC with differential __ BMP __x CMP __x CRP __x ESR __ Vancomycin trough __ CK  _x_ Please pull PIC at completion of IV antibiotics __ Please leave PIC in place until doctor has seen patient or been notified  Fax weekly labs to [phone]  Clinic Follow Up Appt: 06/12/21  @  4:15 pm with Dr. Linus Salmons

## 2021-05-21 DIAGNOSIS — T84038D Mechanical loosening of other internal prosthetic joint, subsequent encounter: Secondary | ICD-10-CM | POA: Diagnosis not present

## 2021-05-21 DIAGNOSIS — H409 Unspecified glaucoma: Secondary | ICD-10-CM | POA: Diagnosis not present

## 2021-05-21 DIAGNOSIS — F32A Depression, unspecified: Secondary | ICD-10-CM | POA: Diagnosis not present

## 2021-05-21 DIAGNOSIS — Z452 Encounter for adjustment and management of vascular access device: Secondary | ICD-10-CM | POA: Diagnosis not present

## 2021-05-21 DIAGNOSIS — T8459XA Infection and inflammatory reaction due to other internal joint prosthesis, initial encounter: Secondary | ICD-10-CM | POA: Diagnosis not present

## 2021-05-21 DIAGNOSIS — E785 Hyperlipidemia, unspecified: Secondary | ICD-10-CM | POA: Diagnosis not present

## 2021-05-21 DIAGNOSIS — K5190 Ulcerative colitis, unspecified, without complications: Secondary | ICD-10-CM | POA: Diagnosis not present

## 2021-05-21 DIAGNOSIS — Z9181 History of falling: Secondary | ICD-10-CM | POA: Diagnosis not present

## 2021-05-21 DIAGNOSIS — I10 Essential (primary) hypertension: Secondary | ICD-10-CM | POA: Diagnosis not present

## 2021-05-21 DIAGNOSIS — M1712 Unilateral primary osteoarthritis, left knee: Secondary | ICD-10-CM | POA: Diagnosis not present

## 2021-05-21 DIAGNOSIS — Z87891 Personal history of nicotine dependence: Secondary | ICD-10-CM | POA: Diagnosis not present

## 2021-05-21 DIAGNOSIS — Z792 Long term (current) use of antibiotics: Secondary | ICD-10-CM | POA: Diagnosis not present

## 2021-05-21 DIAGNOSIS — M19012 Primary osteoarthritis, left shoulder: Secondary | ICD-10-CM | POA: Diagnosis not present

## 2021-05-21 DIAGNOSIS — Z96651 Presence of right artificial knee joint: Secondary | ICD-10-CM | POA: Diagnosis not present

## 2021-05-21 LAB — AEROBIC/ANAEROBIC CULTURE W GRAM STAIN (SURGICAL/DEEP WOUND): Gram Stain: NONE SEEN

## 2021-05-22 ENCOUNTER — Telehealth: Payer: Self-pay

## 2021-05-22 NOTE — Telephone Encounter (Signed)
Patient aware of culture results and to stop taking Doxycyline for now until he follows up with Dr.Comer on 3/8. Patient verbalized his understanding.   [place], CMA

## 2021-05-22 NOTE — Telephone Encounter (Signed)
-----   Message from Thayer Headings, MD sent at 05/22/2021 10:33 AM EST ----- Please let him know his cultures from the shoulder surgery are growing out bacteria (P acnes) and he can continue with the IV ceftriaxone and stop the oral doxycycline for now.  Have him hold on to the doxycycline though as I will likely transition him to oral doxycycline if he is doing well when he follows up with me and stop the IV medicine at that time. He does not need both at the same time though now that we know what is growing.   thanks

## 2021-05-25 LAB — AEROBIC/ANAEROBIC CULTURE W GRAM STAIN (SURGICAL/DEEP WOUND): Gram Stain: NONE SEEN

## 2021-05-27 DIAGNOSIS — F32A Depression, unspecified: Secondary | ICD-10-CM | POA: Diagnosis not present

## 2021-05-27 DIAGNOSIS — Z4731 Aftercare following explantation of shoulder joint prosthesis: Secondary | ICD-10-CM | POA: Diagnosis not present

## 2021-05-27 DIAGNOSIS — I10 Essential (primary) hypertension: Secondary | ICD-10-CM | POA: Diagnosis not present

## 2021-05-27 DIAGNOSIS — T84038D Mechanical loosening of other internal prosthetic joint, subsequent encounter: Secondary | ICD-10-CM | POA: Diagnosis not present

## 2021-05-27 DIAGNOSIS — T8450XA Infection and inflammatory reaction due to unspecified internal joint prosthesis, initial encounter: Secondary | ICD-10-CM | POA: Diagnosis not present

## 2021-05-27 DIAGNOSIS — M1712 Unilateral primary osteoarthritis, left knee: Secondary | ICD-10-CM | POA: Diagnosis not present

## 2021-05-27 DIAGNOSIS — K5190 Ulcerative colitis, unspecified, without complications: Secondary | ICD-10-CM | POA: Diagnosis not present

## 2021-05-27 DIAGNOSIS — Z792 Long term (current) use of antibiotics: Secondary | ICD-10-CM | POA: Diagnosis not present

## 2021-05-27 DIAGNOSIS — T8459XA Infection and inflammatory reaction due to other internal joint prosthesis, initial encounter: Secondary | ICD-10-CM | POA: Diagnosis not present

## 2021-05-27 DIAGNOSIS — Z87891 Personal history of nicotine dependence: Secondary | ICD-10-CM | POA: Diagnosis not present

## 2021-05-27 DIAGNOSIS — Z96651 Presence of right artificial knee joint: Secondary | ICD-10-CM | POA: Diagnosis not present

## 2021-05-27 DIAGNOSIS — Z452 Encounter for adjustment and management of vascular access device: Secondary | ICD-10-CM | POA: Diagnosis not present

## 2021-05-27 DIAGNOSIS — E785 Hyperlipidemia, unspecified: Secondary | ICD-10-CM | POA: Diagnosis not present

## 2021-05-27 DIAGNOSIS — Z4789 Encounter for other orthopedic aftercare: Secondary | ICD-10-CM | POA: Diagnosis not present

## 2021-05-27 DIAGNOSIS — M19012 Primary osteoarthritis, left shoulder: Secondary | ICD-10-CM | POA: Diagnosis not present

## 2021-05-27 DIAGNOSIS — Z9181 History of falling: Secondary | ICD-10-CM | POA: Diagnosis not present

## 2021-05-27 DIAGNOSIS — H409 Unspecified glaucoma: Secondary | ICD-10-CM | POA: Diagnosis not present

## 2021-05-29 ENCOUNTER — Other Ambulatory Visit: Payer: PPO

## 2021-05-30 DIAGNOSIS — T8459XA Infection and inflammatory reaction due to other internal joint prosthesis, initial encounter: Secondary | ICD-10-CM | POA: Diagnosis not present

## 2021-06-03 DIAGNOSIS — Z4731 Aftercare following explantation of shoulder joint prosthesis: Secondary | ICD-10-CM | POA: Diagnosis not present

## 2021-06-04 DIAGNOSIS — Z4731 Aftercare following explantation of shoulder joint prosthesis: Secondary | ICD-10-CM | POA: Diagnosis not present

## 2021-06-06 DIAGNOSIS — T8459XA Infection and inflammatory reaction due to other internal joint prosthesis, initial encounter: Secondary | ICD-10-CM | POA: Diagnosis not present

## 2021-06-06 LAB — AEROBIC/ANAEROBIC CULTURE W GRAM STAIN (SURGICAL/DEEP WOUND)
Culture: NO GROWTH
Gram Stain: NONE SEEN

## 2021-06-07 ENCOUNTER — Telehealth: Payer: Self-pay

## 2021-06-07 NOTE — Telephone Encounter (Signed)
Patient's wife called office today stating that last night at 9 her insurance called her stating antibiotics is not covered under their plan.  ?Spoke with [facility] who is showing medication is approved. Advised patient's wife to call her insurance to follow up on concern. Provided her with number for [facility] to follow up with incase insurance tells her something different. ?Leatrice Jewels, RMA ? ?

## 2021-06-10 DIAGNOSIS — F32A Depression, unspecified: Secondary | ICD-10-CM | POA: Diagnosis not present

## 2021-06-10 DIAGNOSIS — I10 Essential (primary) hypertension: Secondary | ICD-10-CM | POA: Diagnosis not present

## 2021-06-10 DIAGNOSIS — Z792 Long term (current) use of antibiotics: Secondary | ICD-10-CM | POA: Diagnosis not present

## 2021-06-10 DIAGNOSIS — M19012 Primary osteoarthritis, left shoulder: Secondary | ICD-10-CM | POA: Diagnosis not present

## 2021-06-10 DIAGNOSIS — H409 Unspecified glaucoma: Secondary | ICD-10-CM | POA: Diagnosis not present

## 2021-06-10 DIAGNOSIS — K5190 Ulcerative colitis, unspecified, without complications: Secondary | ICD-10-CM | POA: Diagnosis not present

## 2021-06-10 DIAGNOSIS — Z9181 History of falling: Secondary | ICD-10-CM | POA: Diagnosis not present

## 2021-06-10 DIAGNOSIS — M1712 Unilateral primary osteoarthritis, left knee: Secondary | ICD-10-CM | POA: Diagnosis not present

## 2021-06-10 DIAGNOSIS — Z4731 Aftercare following explantation of shoulder joint prosthesis: Secondary | ICD-10-CM | POA: Diagnosis not present

## 2021-06-10 DIAGNOSIS — E785 Hyperlipidemia, unspecified: Secondary | ICD-10-CM | POA: Diagnosis not present

## 2021-06-10 DIAGNOSIS — Z452 Encounter for adjustment and management of vascular access device: Secondary | ICD-10-CM | POA: Diagnosis not present

## 2021-06-10 DIAGNOSIS — Z87891 Personal history of nicotine dependence: Secondary | ICD-10-CM | POA: Diagnosis not present

## 2021-06-10 DIAGNOSIS — T8459XA Infection and inflammatory reaction due to other internal joint prosthesis, initial encounter: Secondary | ICD-10-CM | POA: Diagnosis not present

## 2021-06-10 DIAGNOSIS — Z96651 Presence of right artificial knee joint: Secondary | ICD-10-CM | POA: Diagnosis not present

## 2021-06-10 DIAGNOSIS — T84038D Mechanical loosening of other internal prosthetic joint, subsequent encounter: Secondary | ICD-10-CM | POA: Diagnosis not present

## 2021-06-12 ENCOUNTER — Encounter: Payer: Self-pay | Admitting: Internal Medicine

## 2021-06-12 ENCOUNTER — Other Ambulatory Visit: Payer: Self-pay

## 2021-06-12 ENCOUNTER — Telehealth: Payer: Self-pay

## 2021-06-12 ENCOUNTER — Ambulatory Visit (INDEPENDENT_AMBULATORY_CARE_PROVIDER_SITE_OTHER): Payer: PPO | Admitting: Internal Medicine

## 2021-06-12 DIAGNOSIS — Z96619 Presence of unspecified artificial shoulder joint: Secondary | ICD-10-CM | POA: Insufficient documentation

## 2021-06-12 DIAGNOSIS — Z452 Encounter for adjustment and management of vascular access device: Secondary | ICD-10-CM | POA: Insufficient documentation

## 2021-06-12 DIAGNOSIS — T8459XD Infection and inflammatory reaction due to other internal joint prosthesis, subsequent encounter: Secondary | ICD-10-CM | POA: Diagnosis not present

## 2021-06-12 DIAGNOSIS — Z5181 Encounter for therapeutic drug level monitoring: Secondary | ICD-10-CM | POA: Insufficient documentation

## 2021-06-12 DIAGNOSIS — T8459XA Infection and inflammatory reaction due to other internal joint prosthesis, initial encounter: Secondary | ICD-10-CM | POA: Insufficient documentation

## 2021-06-12 MED ORDER — DOXYCYCLINE HYCLATE 100 MG PO TABS: 100.0000 mg | ORAL_TABLET | Freq: Two times a day (BID) | ORAL | 0 refills | Status: AC

## 2021-06-12 MED ORDER — CEFTRIAXONE IV (FOR PTA / DISCHARGE USE ONLY): 2.0000 g | INTRAVENOUS | 0 refills | Status: AC

## 2021-06-12 NOTE — Assessment & Plan Note (Addendum)
He is doing well clinically and now culture with expected C acnes.  I will have him transition to oral doxyxlycine this week to continue through 3/23 to complete 6 weeks of treatment.  He will then follow up and I will recheck his inflammatory markers after that.  ESR initally 113 and has not been repeated ?CRP trending down 58 > 38 ?Plan discussed with him and family member  ?

## 2021-06-12 NOTE — Assessment & Plan Note (Signed)
Labs reviewed and normal WBC, platelets a bit elevated but no concerns.   ?

## 2021-06-12 NOTE — Progress Notes (Signed)
? ?  Subjective:  ? ? Patient ID: CLEOTHA TSANG, male    DOB: 06/18/1950, 71 y.o.   MRN: [id number] ? ?HPI ?Here for hsfu ?He has a history of ulcerative colitis on Humira and underwent right shoulder reverse arthroplasty in 2013 and developed a prosthetic joint infection and underwent operative removal of the arthroplasty by Dr. Onnie Graham on 05/16/21 with placement of an antibiotic impregnated cement spacer.  He was placed on IV ceftriaxone and oral doxycycline for culture negative infection and after discharge culture grew out C acnes.  He has continued with IV ceftriaxone and tolerating well.  No new concerns.  Shoulder sore. No associated rash or diarrhea.   ? ? ?Review of Systems  ?Constitutional:  Negative for chills and fever.  ?Gastrointestinal:  Negative for diarrhea and nausea.  ?Skin:  Negative for rash.  ? ?   ?Objective:  ? Physical Exam ?Eyes:  ?   General: No scleral icterus. ?Pulmonary:  ?   Effort: Pulmonary effort is normal.  ?Skin: ?   Findings: No rash.  ?Neurological:  ?   Mental Status: He is alert.  ? ? ?SH: no tobacco ? ? ?   ?Assessment & Plan:  ? ? ?

## 2021-06-12 NOTE — Telephone Encounter (Signed)
Orders sent to [facility] per Dr. Linus Salmons to pull PICC after last dose on 06/14/21. Patient will be starting oral doxycycline on 06/15/21. ? ?Beryle Flock, RN ? ?

## 2021-06-12 NOTE — Assessment & Plan Note (Signed)
picc working well, no concerns and will have home health remove this on 06/14/21 ?

## 2021-06-12 NOTE — Patient Instructions (Signed)
Will have the picc line removed around Friday, continue the IV medicine through Friday ?Start doxycycline 100 mg twice a day on Saturday through March 23 rd and stop ?

## 2021-06-13 DIAGNOSIS — T8459XA Infection and inflammatory reaction due to other internal joint prosthesis, initial encounter: Secondary | ICD-10-CM | POA: Diagnosis not present

## 2021-07-01 DIAGNOSIS — Z4789 Encounter for other orthopedic aftercare: Secondary | ICD-10-CM | POA: Diagnosis not present

## 2021-07-09 ENCOUNTER — Encounter: Payer: Self-pay | Admitting: Internal Medicine

## 2021-07-09 ENCOUNTER — Ambulatory Visit (INDEPENDENT_AMBULATORY_CARE_PROVIDER_SITE_OTHER): Payer: PPO | Admitting: Internal Medicine

## 2021-07-09 ENCOUNTER — Other Ambulatory Visit: Payer: Self-pay

## 2021-07-09 VITALS — BP 162/75 | HR 98 | Temp 98.0°F | Ht 72.0 in | Wt 160.0 lb

## 2021-07-09 DIAGNOSIS — K5190 Ulcerative colitis, unspecified, without complications: Secondary | ICD-10-CM | POA: Insufficient documentation

## 2021-07-09 DIAGNOSIS — R21 Rash and other nonspecific skin eruption: Secondary | ICD-10-CM

## 2021-07-09 DIAGNOSIS — Z96611 Presence of right artificial shoulder joint: Secondary | ICD-10-CM | POA: Diagnosis not present

## 2021-07-09 DIAGNOSIS — K51919 Ulcerative colitis, unspecified with unspecified complications: Secondary | ICD-10-CM | POA: Diagnosis not present

## 2021-07-09 DIAGNOSIS — T8459XD Infection and inflammatory reaction due to other internal joint prosthesis, subsequent encounter: Secondary | ICD-10-CM | POA: Diagnosis not present

## 2021-07-09 DIAGNOSIS — Z96619 Presence of unspecified artificial shoulder joint: Secondary | ICD-10-CM | POA: Diagnosis not present

## 2021-07-09 NOTE — Progress Notes (Signed)
? ?  Subjective:  ? ? Patient ID: Levi Turner, male    DOB: [birth year], 71 y.o.   MRN: [id number] ? ?HPI ?Here for hsfu ?He has a history of ulcerative colitis on Humira and underwent right shoulder reverse arthroplasty in 2013 and developed a prosthetic joint infection and underwent operative removal of the arthroplasty by Dr. Onnie Graham on 05/16/21 with placement of an antibiotic impregnated cement spacer.  He was placed on IV ceftriaxone and oral doxycycline for culture negative infection and after discharge culture grew out C acnes.  He has continued with IV ceftriaxone and this was stopped and changed back to doxycycline.  He completed 6 weeks on 3/23 but has continued with the doxycycline since he had extra.  His shoulder is stable though he notes some warmth and is supposed to keep in in a sling.  He also noted a rash on his arm.  ? ? ?Review of Systems  ?Constitutional:  Negative for chills.  ?Gastrointestinal:  Negative for diarrhea.  ?Skin:  Positive for rash.  ? ?   ?Objective:  ? Physical Exam ?Eyes:  ?   General: No scleral icterus. ?Pulmonary:  ?   Effort: Pulmonary effort is normal.  ?Musculoskeletal:  ?   Comments: Right shoulder with mild warmth; 1 cm circular area on mid upper arm that is scaly with a palpable border.   ?Skin: ?   Findings: No rash.  ?Neurological:  ?   Mental Status: He is alert.  ? ? ?SH: no tobacco ? ? ?   ?Assessment & Plan:  ? ? ?

## 2021-07-09 NOTE — Assessment & Plan Note (Signed)
He is followed by Dr. Michail Sermon and has been off of Humira due to infection.  OK from and ID standpoint for him to get back on Humira as indicated by GI.   ?

## 2021-07-09 NOTE — Assessment & Plan Note (Signed)
Seems to be improving well.  I will have him stop the doxycycline now and observe off of antibiotics.  I will also repeat the ESR and CRP.   ?If there are no concerns, he can return as needed.  ?

## 2021-07-09 NOTE — Assessment & Plan Note (Addendum)
Clinically looks like a tinea rash and can use OTC antifungal such as clotrimazole until gone.   ?If no improvement, can reevaluate for something else.  ?

## 2021-07-10 LAB — SEDIMENTATION RATE: Sed Rate: 33 mm/h — ABNORMAL HIGH (ref 0–20)

## 2021-07-10 LAB — C-REACTIVE PROTEIN: CRP: 30.1 mg/L — ABNORMAL HIGH (ref <8.0)

## 2021-07-22 DIAGNOSIS — K5100 Ulcerative (chronic) pancolitis without complications: Secondary | ICD-10-CM | POA: Diagnosis not present

## 2021-07-31 DIAGNOSIS — Z4789 Encounter for other orthopedic aftercare: Secondary | ICD-10-CM | POA: Diagnosis not present

## 2021-08-30 DIAGNOSIS — I10 Essential (primary) hypertension: Secondary | ICD-10-CM | POA: Diagnosis not present

## 2021-08-30 DIAGNOSIS — E7800 Pure hypercholesterolemia, unspecified: Secondary | ICD-10-CM | POA: Diagnosis not present

## 2021-08-30 DIAGNOSIS — N529 Male erectile dysfunction, unspecified: Secondary | ICD-10-CM | POA: Diagnosis not present

## 2021-08-30 DIAGNOSIS — Z0000 Encounter for general adult medical examination without abnormal findings: Secondary | ICD-10-CM | POA: Diagnosis not present

## 2021-08-30 DIAGNOSIS — N400 Enlarged prostate without lower urinary tract symptoms: Secondary | ICD-10-CM | POA: Diagnosis not present

## 2021-08-30 DIAGNOSIS — Z1389 Encounter for screening for other disorder: Secondary | ICD-10-CM | POA: Diagnosis not present

## 2021-08-30 DIAGNOSIS — K51919 Ulcerative colitis, unspecified with unspecified complications: Secondary | ICD-10-CM | POA: Diagnosis not present

## 2021-08-30 DIAGNOSIS — Z125 Encounter for screening for malignant neoplasm of prostate: Secondary | ICD-10-CM | POA: Diagnosis not present

## 2021-09-03 DIAGNOSIS — K5100 Ulcerative (chronic) pancolitis without complications: Secondary | ICD-10-CM | POA: Diagnosis not present

## 2021-09-03 DIAGNOSIS — E7800 Pure hypercholesterolemia, unspecified: Secondary | ICD-10-CM | POA: Diagnosis not present

## 2021-09-03 DIAGNOSIS — F411 Generalized anxiety disorder: Secondary | ICD-10-CM | POA: Diagnosis not present

## 2021-09-03 DIAGNOSIS — I10 Essential (primary) hypertension: Secondary | ICD-10-CM | POA: Diagnosis not present

## 2021-09-03 DIAGNOSIS — N400 Enlarged prostate without lower urinary tract symptoms: Secondary | ICD-10-CM | POA: Diagnosis not present

## 2021-09-03 DIAGNOSIS — Z0000 Encounter for general adult medical examination without abnormal findings: Secondary | ICD-10-CM | POA: Diagnosis not present

## 2021-09-03 DIAGNOSIS — N529 Male erectile dysfunction, unspecified: Secondary | ICD-10-CM | POA: Diagnosis not present

## 2021-09-11 ENCOUNTER — Other Ambulatory Visit: Payer: Self-pay | Admitting: Orthopedic Surgery

## 2021-09-11 DIAGNOSIS — Z4789 Encounter for other orthopedic aftercare: Secondary | ICD-10-CM | POA: Diagnosis not present

## 2021-09-12 DIAGNOSIS — K5100 Ulcerative (chronic) pancolitis without complications: Secondary | ICD-10-CM | POA: Diagnosis not present

## 2021-09-13 DIAGNOSIS — K5100 Ulcerative (chronic) pancolitis without complications: Secondary | ICD-10-CM | POA: Diagnosis not present

## 2021-09-16 DIAGNOSIS — H40033 Anatomical narrow angle, bilateral: Secondary | ICD-10-CM | POA: Diagnosis not present

## 2021-09-16 DIAGNOSIS — H2513 Age-related nuclear cataract, bilateral: Secondary | ICD-10-CM | POA: Diagnosis not present

## 2021-09-17 DIAGNOSIS — Z4789 Encounter for other orthopedic aftercare: Secondary | ICD-10-CM | POA: Diagnosis not present

## 2021-09-17 DIAGNOSIS — Z79899 Other long term (current) drug therapy: Secondary | ICD-10-CM | POA: Diagnosis not present

## 2021-09-19 ENCOUNTER — Ambulatory Visit
Admission: RE | Admit: 2021-09-19 | Discharge: 2021-09-19 | Disposition: A | Discharge status: Home / Self Care | Payer: PPO | Source: Ambulatory Visit | Attending: Orthopedic Surgery | Admitting: Orthopedic Surgery

## 2021-09-19 DIAGNOSIS — Z96611 Presence of right artificial shoulder joint: Secondary | ICD-10-CM

## 2021-09-19 DIAGNOSIS — S42201K Unspecified fracture of upper end of right humerus, subsequent encounter for fracture with nonunion: Secondary | ICD-10-CM | POA: Diagnosis not present

## 2021-09-19 DIAGNOSIS — M19011 Primary osteoarthritis, right shoulder: Secondary | ICD-10-CM | POA: Diagnosis not present

## 2021-09-19 DIAGNOSIS — Z872 Personal history of diseases of the skin and subcutaneous tissue: Secondary | ICD-10-CM | POA: Diagnosis not present

## 2021-09-19 DIAGNOSIS — I2510 Atherosclerotic heart disease of native coronary artery without angina pectoris: Secondary | ICD-10-CM | POA: Diagnosis not present

## 2021-10-16 DIAGNOSIS — Z4789 Encounter for other orthopedic aftercare: Secondary | ICD-10-CM | POA: Diagnosis not present

## 2021-11-19 DIAGNOSIS — K5100 Ulcerative (chronic) pancolitis without complications: Secondary | ICD-10-CM | POA: Diagnosis not present

## 2021-12-02 DIAGNOSIS — K5100 Ulcerative (chronic) pancolitis without complications: Secondary | ICD-10-CM | POA: Diagnosis not present

## 2022-02-03 DIAGNOSIS — K5100 Ulcerative (chronic) pancolitis without complications: Secondary | ICD-10-CM | POA: Diagnosis not present

## 2022-02-18 DIAGNOSIS — K5100 Ulcerative (chronic) pancolitis without complications: Secondary | ICD-10-CM | POA: Diagnosis not present

## 2022-03-05 DIAGNOSIS — K5100 Ulcerative (chronic) pancolitis without complications: Secondary | ICD-10-CM | POA: Diagnosis not present

## 2022-03-18 DIAGNOSIS — I10 Essential (primary) hypertension: Secondary | ICD-10-CM | POA: Diagnosis not present

## 2022-03-18 DIAGNOSIS — Z6824 Body mass index (BMI) 24.0-24.9, adult: Secondary | ICD-10-CM | POA: Diagnosis not present

## 2022-04-04 DIAGNOSIS — K5100 Ulcerative (chronic) pancolitis without complications: Secondary | ICD-10-CM | POA: Diagnosis not present

## 2022-05-30 DIAGNOSIS — K5100 Ulcerative (chronic) pancolitis without complications: Secondary | ICD-10-CM | POA: Diagnosis not present

## 2022-06-11 DIAGNOSIS — K5100 Ulcerative (chronic) pancolitis without complications: Secondary | ICD-10-CM | POA: Diagnosis not present

## 2022-07-25 DIAGNOSIS — K5100 Ulcerative (chronic) pancolitis without complications: Secondary | ICD-10-CM | POA: Diagnosis not present

## 2022-09-02 DIAGNOSIS — Z125 Encounter for screening for malignant neoplasm of prostate: Secondary | ICD-10-CM | POA: Diagnosis not present

## 2022-09-02 DIAGNOSIS — I10 Essential (primary) hypertension: Secondary | ICD-10-CM | POA: Diagnosis not present

## 2022-09-02 DIAGNOSIS — E7800 Pure hypercholesterolemia, unspecified: Secondary | ICD-10-CM | POA: Diagnosis not present

## 2022-09-02 DIAGNOSIS — Z79624 Long term (current) use of inhibitors of nucleotide synthesis: Secondary | ICD-10-CM | POA: Diagnosis not present

## 2022-09-02 DIAGNOSIS — Z0000 Encounter for general adult medical examination without abnormal findings: Secondary | ICD-10-CM | POA: Diagnosis not present

## 2022-09-02 DIAGNOSIS — N529 Male erectile dysfunction, unspecified: Secondary | ICD-10-CM | POA: Diagnosis not present

## 2022-09-02 DIAGNOSIS — N400 Enlarged prostate without lower urinary tract symptoms: Secondary | ICD-10-CM | POA: Diagnosis not present

## 2022-09-02 DIAGNOSIS — K51919 Ulcerative colitis, unspecified with unspecified complications: Secondary | ICD-10-CM | POA: Diagnosis not present

## 2022-09-02 DIAGNOSIS — D84821 Immunodeficiency due to drugs: Secondary | ICD-10-CM | POA: Diagnosis not present

## 2022-09-02 DIAGNOSIS — F411 Generalized anxiety disorder: Secondary | ICD-10-CM | POA: Diagnosis not present

## 2022-09-19 DIAGNOSIS — K5100 Ulcerative (chronic) pancolitis without complications: Secondary | ICD-10-CM | POA: Diagnosis not present

## 2022-11-14 DIAGNOSIS — K5100 Ulcerative (chronic) pancolitis without complications: Secondary | ICD-10-CM | POA: Diagnosis not present

## 2023-01-09 DIAGNOSIS — K5100 Ulcerative (chronic) pancolitis without complications: Secondary | ICD-10-CM | POA: Diagnosis not present

## 2023-03-04 DIAGNOSIS — R972 Elevated prostate specific antigen [PSA]: Secondary | ICD-10-CM | POA: Diagnosis not present

## 2023-03-04 DIAGNOSIS — N4232 Atypical small acinar proliferation of prostate: Secondary | ICD-10-CM | POA: Diagnosis not present

## 2023-03-09 DIAGNOSIS — K5100 Ulcerative (chronic) pancolitis without complications: Secondary | ICD-10-CM | POA: Diagnosis not present

## 2023-03-10 ENCOUNTER — Other Ambulatory Visit: Payer: Self-pay | Admitting: Urology

## 2023-03-10 DIAGNOSIS — R972 Elevated prostate specific antigen [PSA]: Secondary | ICD-10-CM

## 2023-03-20 DIAGNOSIS — N529 Male erectile dysfunction, unspecified: Secondary | ICD-10-CM | POA: Diagnosis not present

## 2023-03-20 DIAGNOSIS — E7800 Pure hypercholesterolemia, unspecified: Secondary | ICD-10-CM | POA: Diagnosis not present

## 2023-03-20 DIAGNOSIS — F411 Generalized anxiety disorder: Secondary | ICD-10-CM | POA: Diagnosis not present

## 2023-03-20 DIAGNOSIS — Z6824 Body mass index (BMI) 24.0-24.9, adult: Secondary | ICD-10-CM | POA: Diagnosis not present

## 2023-03-20 DIAGNOSIS — I10 Essential (primary) hypertension: Secondary | ICD-10-CM | POA: Diagnosis not present

## 2023-04-28 ENCOUNTER — Ambulatory Visit
Admission: RE | Admit: 2023-04-28 | Discharge: 2023-04-28 | Disposition: A | Discharge status: Home / Self Care | Payer: PPO | Source: Ambulatory Visit | Attending: Urology | Admitting: Urology

## 2023-04-28 ENCOUNTER — Other Ambulatory Visit: Payer: Self-pay | Admitting: Urology

## 2023-04-28 DIAGNOSIS — R972 Elevated prostate specific antigen [PSA]: Secondary | ICD-10-CM

## 2023-05-14 DIAGNOSIS — K5100 Ulcerative (chronic) pancolitis without complications: Secondary | ICD-10-CM | POA: Diagnosis not present

## 2023-06-11 ENCOUNTER — Telehealth: Payer: Self-pay

## 2023-06-11 NOTE — Patient Outreach (Signed)
 Aging Gracefully Program  06/11/2023  VALIANT DILLS 1950-06-29 [id number]   [facility] Evaluation Interviewer made contact with patient. Aging Gracefully survey completed.   Interviewer will send referral to RN and OT for follow up.    [facility]  Population Health Care Management Assistant  Direct Dial: [phone]  Fax: [phone] Website: [email or web address]

## 2023-06-16 DIAGNOSIS — K5100 Ulcerative (chronic) pancolitis without complications: Secondary | ICD-10-CM | POA: Diagnosis not present

## 2023-06-17 ENCOUNTER — Ambulatory Visit
Admission: RE | Admit: 2023-06-17 | Discharge: 2023-06-17 | Disposition: A | Discharge status: Home / Self Care | Payer: PPO | Source: Ambulatory Visit | Attending: Urology

## 2023-06-17 ENCOUNTER — Ambulatory Visit
Admission: RE | Admit: 2023-06-17 | Discharge: 2023-06-17 | Disposition: A | Discharge status: Home / Self Care | Source: Ambulatory Visit | Attending: Urology | Admitting: Urology

## 2023-06-17 DIAGNOSIS — Z1810 Retained metal fragments, unspecified: Secondary | ICD-10-CM | POA: Diagnosis not present

## 2023-06-17 DIAGNOSIS — R972 Elevated prostate specific antigen [PSA]: Secondary | ICD-10-CM | POA: Diagnosis not present

## 2023-06-17 DIAGNOSIS — Z01818 Encounter for other preprocedural examination: Secondary | ICD-10-CM | POA: Diagnosis not present

## 2023-06-17 DIAGNOSIS — M1712 Unilateral primary osteoarthritis, left knee: Secondary | ICD-10-CM | POA: Diagnosis not present

## 2023-06-17 MED ORDER — GADOPICLENOL 0.5 MMOL/ML IV SOLN
7.0000 mL | Freq: Once | INTRAVENOUS | Status: AC | PRN
  Administered 2023-06-17: 7 mL via INTRAVENOUS

## 2023-06-26 ENCOUNTER — Other Ambulatory Visit: Payer: Self-pay | Admitting: Occupational Therapy

## 2023-06-28 NOTE — Patient Outreach (Signed)
 Aging Gracefully Program  OT Initial Visit  06/28/2023  [name] 01-28-1951 [id number]  Visit:  1- Initial Visit  Start Time:  1645 End Time:  1730 Total Minutes:  45  CCAP: Typical Daily Routine: What Types Of Care Problems Are You Having Throughout The Day?: putting on taking off shirts What Kind Of Help Do You Receive?: wife assists as needed Functional Mobility-Reaching For Items Above Shoulder Level: Reaching For Items Above Shoulder Level: Moderate Difficulty Do You:: No Device/No Assistance Importance Of Learning New Strategies:: Moderate Observation: Reaching For Items Above Shoulder Level: Moderate Assistance (with RUE, limited shoulder flexion) Safety: No Risk Efficiency: Somewhat Intervention: Yes Other Comments:: possibily a reacher Functional Mobility-Climb 1 Flight Of Stairs: Climb 1 Flight Of Stairs: No Difficulty Do You:: Use A Device (hand rail) Importance Of Learning New Strategies:: A Little Safety: A Little Risk Efficiency: Very Intervention: Yes Other Comments:: second rail on steps to basement Functional Mobility-Move In And Out Of Bath/Shower: Move In And Out Of A Bath/Shower: A Little Difficulty Do You:: No Device/No Assistance Importance Of Learning New Strategies:: Very Much Safety: A Little Risk Efficiency: Very Intervention: Yes Other Comments:: possibly fix wallk in shower in master bath; tub to shower converion or tub cut out in hall bath Activities of Daily Living-Personal Hygiene and Grooming: Personal Hygiene and Grooming: A Little Difficulty Do You:: No Device/No Assistance Importance Of Learning New Strategies: A Little Safety: No Risk Efficiency: Somewhat Intervention: Yes Other Comments:: possibly using a hands free hair dryer stand  Readiness To Change Score:  Readiness to Change Score: 10  Home Environment Assessment: Outside Home Entry:: 2 steps onto porch and 1 into house. 1 hand rail on the right as you go into  the house. Living Room:: A couple of places where the carpert is pucking, but seems to be in a symmetrical line across the living room floor Kitchen:: A couple of places where the flooring tiles are puckering and chipping Stairs:: to basement has only 1 rail on the steps on the right as you descent them Bathroom:: Hall bath; tub with curtain, comfort height toilet, no grab bars, no handheld shower head. Master bath: comfort height toilet, no working small walk in Restaurant manager, fast food:: downstairs Other Home Environment Concerns:: water damage on ceilings in two rooms  Goals:  Goals Addressed               This Visit's Progress     Patient Stated (pt-stated)        Safety up and down basement step. A hand rail on the right as you go down the steps would help.      Patient Stated (pt-stated)        Safety in the master bathroom: Grab bar on to the left of the toilet as you are sitting on it. Possibly fix the small walk in shower to make it functional and if cannot please fix it so it does not leak.      Patient Stated (pt-stated)        Ease with drying hair--hands free hair dryer holder may help with this.        Post Clinical Reasoning: Clinician View Of Client Situation:: This was my first visit with Levi Turner. He does well, does not need to use an assisitve device for ambulation. 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 His wife sometime helps him as needed. Client View Of His/Her Situation:: [name] feels likes he does well for himself (and I agree). His biggest issue is his right shoulder. Next Visit Plan:: hands free hair dryer holder  [place], OT Aging Gracefully [phone]

## 2023-06-30 ENCOUNTER — Other Ambulatory Visit: Payer: Self-pay | Admitting: *Deleted

## 2023-06-30 NOTE — Patient Outreach (Signed)
 Aging Gracefully Program  06/30/2023  DENT PLANTZ [birth year] [id number]   Telephone call to Mrs. Brisbon (spouse) to schedule joint Aging Gracefully home visit. Scheduled home visit on 07/15/23 at 11 am. Mrs. Hausmann's appt scheduled for 10 am.   Clinical research associate provided contact information.   Raiford Noble, MSN, RN, BSN [place]  [facility], Healthy Communities RN Case Manager for Aging Gracefully Direct Dial: [phone]

## 2023-07-03 DIAGNOSIS — R972 Elevated prostate specific antigen [PSA]: Secondary | ICD-10-CM | POA: Diagnosis not present

## 2023-07-07 DIAGNOSIS — Z09 Encounter for follow-up examination after completed treatment for conditions other than malignant neoplasm: Secondary | ICD-10-CM | POA: Diagnosis not present

## 2023-07-07 DIAGNOSIS — K5289 Other specified noninfective gastroenteritis and colitis: Secondary | ICD-10-CM | POA: Diagnosis not present

## 2023-07-07 DIAGNOSIS — K6389 Other specified diseases of intestine: Secondary | ICD-10-CM | POA: Diagnosis not present

## 2023-07-07 DIAGNOSIS — D128 Benign neoplasm of rectum: Secondary | ICD-10-CM | POA: Diagnosis not present

## 2023-07-07 DIAGNOSIS — K5100 Ulcerative (chronic) pancolitis without complications: Secondary | ICD-10-CM | POA: Diagnosis not present

## 2023-07-07 DIAGNOSIS — K5140 Inflammatory polyps of colon without complications: Secondary | ICD-10-CM | POA: Diagnosis not present

## 2023-07-07 DIAGNOSIS — Z860100 Personal history of colon polyps, unspecified: Secondary | ICD-10-CM | POA: Diagnosis not present

## 2023-07-09 DIAGNOSIS — K5289 Other specified noninfective gastroenteritis and colitis: Secondary | ICD-10-CM | POA: Diagnosis not present

## 2023-07-09 DIAGNOSIS — K5140 Inflammatory polyps of colon without complications: Secondary | ICD-10-CM | POA: Diagnosis not present

## 2023-07-09 DIAGNOSIS — D128 Benign neoplasm of rectum: Secondary | ICD-10-CM | POA: Diagnosis not present

## 2023-07-15 ENCOUNTER — Encounter: Payer: Self-pay | Admitting: *Deleted

## 2023-07-15 ENCOUNTER — Other Ambulatory Visit: Payer: Self-pay | Admitting: *Deleted

## 2023-07-15 NOTE — Patient Outreach (Signed)
 Aging Gracefully Program  RN Visit  07/15/2023  Levi Turner 06-09-1950 [id number]  Visit:  RN Visit Number: 1- Initial Visit  RN TIME CALCULATION: Start TIme:  RN Start Time Calculation: 1000 End Time:  RN Stop Time Calculation: 1205 Total Minutes:  RN Time Calculation: 125  Readiness To Change Score:  Readiness to Change Score: 9.33  Universal RN Interventions: Calendar Distribution: Yes Exercise Review: No Medications: Yes Medication Changes: Yes Mood: Yes Pain: Yes PCP Advocacy/Support: No Fall Prevention: Yes Incontinence: No Clinician View Of Client Situation: Arrived to home for home visit. Joint visit with Levi Turner and wife. Mr. Levi Turner ambulating independently without AD. Home clean, neat, and without clutter. Client View Of His/Her Situation: Mr. Levi Turner reports chronic right shoulder pain. Reports patin 2 out of 10 during home visit.  Healthcare Provider Communication: Did Surveyor, mining With [employer] Provider?: No Healthcare Provider Response According to RN: N/A According to Client, Did PCP Report Communication With An Aging Gracefully RN?: No Healthcare Provider Response According To Client: N/A  Clinician View of Client Situation: Clinician View Of Client Situation: Arrived to home for home visit. Joint visit with Levi Turner and wife. Mr. Levi Turner ambulating independently without AD. Home clean, neat, and without clutter. Client's View of His/Her Situation: Client View Of His/Her Situation: Mr. Levi Turner reports chronic right shoulder pain. Reports patin 2 out of 10 during home visit.  Medication Assessment: Do You Have Any Problems Paying For Medications?: No Where Does Client Store Medications?: Kitchen Table Can Client Read Pill Bottles?: Yes Does Client Use A Pillbox?: Yes Does Anyone Assist Client In Filling Pillbox?: No Does Anyone Assist Client In Taking Medications?: No Do You Take Vitamin D?: No Total Number Of  Medications That The Client Takes: 15 Does Client Have Any Questions Or Concerns About Medictions?: No Is Client Complaining Of Any Symptoms That Could Be Side Effects To Medications?: No Any Possible Changes In Medication Regimen?: No  OT Update: Pending [employer] assessments and contracts.  Session Summary: Levi Turner reports not having much motivation to do things. Reports pain is 2 out of 10. Mr. Lunz denies any further concerns.    Goals Addressed               This Visit's Progress     AG RN (pt-stated)        07/15/23  Assessment: Levi Turner reports no longer doing things he enjoyed most. Enjoyed fishing and hunting. Reports being mostly inactive. Does not have the motivation to organize or clean clutter areas in the basement and garage. Reports shoulder pain impedes a lot of activities for him. 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 Has had issues with right shoulder for years. States he enjoys yard work and being outside. However, states he lacks motivation to do much more than that.  Interventions: Encouraged Levi Turner to take advantage of his Silver [employer]neakers membership at [employer]. Brainstomed with Mr. Chiou about possible activities at the [facility] he may possibly enjoy. Encouraged Levi Turner to do one small activity at a time to begin decluttering and organizing. Provided chronic medical conditions notebook, calendar, and writer's contact information.   Plan: Scheduled next joint home visit for May 15th at 10 am.  CLIENT/RN ACTION PLAN - MOOD  Registered Nurse:  Levi Turner Date: 07/15/2023  Client Name:  Levi Turner Client ID:    Target Area:  MOOD   Why Problem May Occur: "after working for  many years, now want to do what I want to do."    What Client would like to do for herself/himself: "would like to get more organized and become more active"  over the next 160 days.     STRATEGIES Why Do I Feel Blue: Ideas  Taking Care of Other People or Caregiver Fatigue  Reviewed 07/15/2023 Do something nice for yourself: Read a book Take a bath Garden Listen to music Write in a journal Write poetry   Diabetes  N/A Control your blood sugar. Take your diabetes medication. Avoid sweets.   Pain Reviewed 07/15/2023 Control your pain. Pain can prevent you from being social. Over the counter pain medicine can decrease pain. Keep moving, exercise can help pain. See Action Plan for Pain.   Medical Problems Reviewed 07/15/2023  Some illnesses can cause depression. Depression can be a side effect of some medicines. Tell your Healthcare Provider if you have been feeling down.                      Coping Strategies Ideas  Prayer/Meditation Reviewed 07/15/2023 Prayer can give comfort and hope which makes people feel better. Meditation can decrease anxiety and make you feel calm.     Social activities Reviewed 07/15/2023 Go to church. See friends and family. Volunteer to help others. Call friends and family on the phone.   Exercise/Activity Reviewed 07/15/2023 Aging Gracefully Exercises Walking (inside or outside) Dancing, swimming, gardening Make artwork or crafts:  painting, cards, scrapbooks Singing and listening to music   Talk or Write About Your Feelings Reviewed 07/15/2023 See a therapist/counselor or support group. Talk to friends and family about your feelings. Write about your feelings in your journal.   Medicine Reviewed 07/15/2023 Anti-depressants, there are a lot of different kinds to try. Pain medication, even over the counter pain medicine can control your pain better. Depression can be a side effect to some medications.  If you are feeling down tell your Healthcare Provider.   Prevention Ideas                  PRACTICE It is important to practice the strategies so we can determine if they will be effective in helping to reach the goal.     Follow these specific recommendations:        If strategy does not work the first time, try it again.     We may make some changes over the next few sessions.       Levi Noble, MSN, RN, BSN [place]  [facility], Healthy Communities RN Case Manager for Aging Gracefully Direct Dial: [phone]                                   Levi Noble, MSN, RN, BSN [place]  [facility], Healthy Communities RN Case Manager for Aging Gracefully Direct Dial: [phone]

## 2023-07-15 NOTE — Patient Instructions (Signed)
 Visit Information  Thank you for taking time to visit with me today. Please don't hesitate to contact me if I can be of assistance to you before our next scheduled home appointment.  Following are the goals we discussed today:  (Copy and paste patient goals from clinical care plan here)  Our next appointment is on Aug 20, 2023 at 10 am  If you are experiencing a Mental Health or Behavioral Health Crisis or need someone to talk to, please call the Suicide and Crisis Lifeline: 988 call the [place] National Suicide Prevention Lifeline: 9844408258 or TTY: (931)710-5211 TTY (321)289-5625) to talk to a trained counselor call 1-800-273-TALK (toll free, 24 hour hotline) go to [facility] [address], [place] [phone]) call 911   The patient verbalized understanding of instructions, educational materials, and care plan provided today and agreed to receive a mailed copy of patient instructions, educational materials, and care plan.    Goals Addressed               This Visit's Progress     AG RN (pt-stated)        07/15/23  Assessment: Levi Turner reports no longer doing things he enjoyed most. Enjoyed fishing and hunting. Reports being mostly inactive. Does not have the motivation to organize or clean clutter areas in the basement and garage. Reports shoulder pain impedes a lot of activities for him. 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 Has had issues with right shoulder for years. States he enjoys yard work and being outside. However, states he lacks motivation to do much more than that.  Interventions: Encouraged Levi Turner to take advantage of his Silver [employer]neakers membership at [employer]. Brainstomed with Levi Turner about possible activities at the [facility] he may possibly enjoy. Encouraged Levi Turner to do one small activity at a time to begin decluttering and organizing. Provided chronic medical  conditions notebook, calendar, and writer's contact information.   Plan: Scheduled next joint home visit for May 15th at 10 am.  CLIENT/RN ACTION PLAN - MOOD  Registered Nurse:  Raiford Noble Date: 07/15/2023  Client Name:  Levi Turner Client ID:    Target Area:  MOOD   Why Problem May Occur: "after working for many years, now want to do what I want to do."    What Client would like to do for herself/himself: "would like to get more organized and become more active"  over the next 160 days.    STRATEGIES Why Do I Feel Blue: Ideas  Taking Care of Other People or Caregiver Fatigue  Reviewed 07/15/2023 Do something nice for yourself: Read a book Take a bath Garden Listen to music Write in a journal Write poetry   Diabetes  N/A Control your blood sugar. Take your diabetes medication. Avoid sweets.   Pain Reviewed 07/15/2023 Control your pain. Pain can prevent you from being social. Over the counter pain medicine can decrease pain. Keep moving, exercise can help pain. See Action Plan for Pain.   Medical Problems Reviewed 07/15/2023  Some illnesses can cause depression. Depression can be a side effect of some medicines. Tell your Healthcare Provider if you have been feeling down.                      Coping Strategies Ideas  Prayer/Meditation Reviewed 07/15/2023 Prayer can give comfort and hope which makes people feel better. Meditation can decrease anxiety and make you feel  calm.     Social activities Reviewed 07/15/2023 Go to church. See friends and family. Volunteer to help others. Call friends and family on the phone.   Exercise/Activity Reviewed 07/15/2023 Aging Gracefully Exercises Walking (inside or outside) Dancing, swimming, gardening Make artwork or crafts:  painting, cards, scrapbooks Singing and listening to music   Talk or Write About Your Feelings Reviewed 07/15/2023 See a therapist/counselor or support group. Talk to friends and family about your  feelings. Write about your feelings in your journal.   Medicine Reviewed 07/15/2023 Anti-depressants, there are a lot of different kinds to try. Pain medication, even over the counter pain medicine can control your pain better. Depression can be a side effect to some medications.  If you are feeling down tell your Healthcare Provider.   Prevention Ideas                  PRACTICE It is important to practice the strategies so we can determine if they will be effective in helping to reach the goal.    Follow these specific recommendations:        If strategy does not work the first time, try it again.     We may make some changes over the next few sessions.       Raiford Noble, MSN, RN, BSN [place]  [facility], Healthy Communities RN Case Manager for Aging Gracefully Direct Dial: [phone]                                    Raiford Noble, MSN, RN, BSN [place]  [facility], Healthy Communities RN Case Manager for Aging Gracefully Direct Dial: [phone]

## 2023-07-22 DIAGNOSIS — K5100 Ulcerative (chronic) pancolitis without complications: Secondary | ICD-10-CM | POA: Diagnosis not present

## 2023-07-28 DIAGNOSIS — R972 Elevated prostate specific antigen [PSA]: Secondary | ICD-10-CM | POA: Diagnosis not present

## 2023-07-28 DIAGNOSIS — R3121 Asymptomatic microscopic hematuria: Secondary | ICD-10-CM | POA: Diagnosis not present

## 2023-07-28 DIAGNOSIS — D075 Carcinoma in situ of prostate: Secondary | ICD-10-CM | POA: Diagnosis not present

## 2023-07-31 ENCOUNTER — Other Ambulatory Visit: Payer: Self-pay | Admitting: Urology

## 2023-07-31 DIAGNOSIS — R3129 Other microscopic hematuria: Secondary | ICD-10-CM

## 2023-08-04 DIAGNOSIS — N4232 Atypical small acinar proliferation of prostate: Secondary | ICD-10-CM | POA: Diagnosis not present

## 2023-08-04 DIAGNOSIS — R3121 Asymptomatic microscopic hematuria: Secondary | ICD-10-CM | POA: Diagnosis not present

## 2023-08-04 DIAGNOSIS — R972 Elevated prostate specific antigen [PSA]: Secondary | ICD-10-CM | POA: Diagnosis not present

## 2023-08-05 ENCOUNTER — Other Ambulatory Visit: Payer: Self-pay | Admitting: Occupational Therapy

## 2023-08-09 NOTE — Patient Outreach (Signed)
 Aging Gracefully Program  OT Follow-Up Visit  Date of Service 08/05/2023  ZIERE KUTI 10-12-1950 [id number]  Visit:  2- Second Visit  Start Time:  1230 End Time:  1300 Total Minutes:  30  Readiness to Change Score :  Readiness to Change Score: 10  Patient Education: Education Provided: Yes Education Details: getting up from a fall and safey checklist at home Person(s) Educated: Patient Comprehension: Verbalized Understanding  Goals:  This was an education only visit   Post Clinical Reasoning: Clinician View Of Client Situation:: Mr Braden is doing well and was very open to the education and handouts provided Client View Of His/Her Situation:: He continues to do well with getting up and about and doing the things he needs to do. Next Visit Plan:: follow up on any modifications that have been done with goals  Merryl Abraham. OT Acute Rehabilitation Services Office [phone]

## 2023-08-14 ENCOUNTER — Ambulatory Visit
Admission: RE | Admit: 2023-08-14 | Discharge: 2023-08-14 | Disposition: A | Discharge status: Home / Self Care | Source: Ambulatory Visit | Attending: Urology | Admitting: Urology

## 2023-08-14 DIAGNOSIS — R3129 Other microscopic hematuria: Secondary | ICD-10-CM | POA: Diagnosis not present

## 2023-08-14 MED ORDER — IOPAMIDOL (ISOVUE-300) INJECTION 61%
125.0000 mL | Freq: Once | INTRAVENOUS | Status: AC | PRN
Start: 2023-08-14 — End: 2023-08-14
  Administered 2023-08-14: 125 mL via INTRAVENOUS

## 2023-08-20 ENCOUNTER — Encounter: Payer: Self-pay | Admitting: *Deleted

## 2023-08-20 ENCOUNTER — Other Ambulatory Visit: Payer: Self-pay | Admitting: *Deleted

## 2023-08-20 NOTE — Patient Outreach (Signed)
 Aging Gracefully Program  RN Visit  08/20/2023  Levi Turner 25-May-1950 [id number]  Visit:  RN Visit Number: 2- Second Visit  RN TIME CALCULATION: Start TIme:  RN Start Time Calculation: 1000 End Time:  RN Stop Time Calculation: 1100 Total Minutes:  RN Time Calculation: 60  Readiness To Change Score:  Readiness to Change Score: 9.33  Universal RN Interventions: Calendar Distribution: No Exercise Review: Yes Medications: Yes Medication Changes: No Mood: Yes Pain: Yes PCP Advocacy/Support: No Fall Prevention: Yes Incontinence: Yes Clinician View Of Client Situation: Arrived to home for joint visit for Levi Turner and spouse. Levi Turner answered door. Ambulates independently without assistive device. Home neat, clean, and without clutter. Client View Of His/Her Situation: [name] reports he is feeling better and more motivated. States good reports from recent tests and warm weather contributes to his brighter mood. Reports he is looking forward to [facility] work to begin.  Healthcare Provider Communication: Did Surveyor, mining With [employer] Provider?: No Healthcare Provider Response According to RN: N/A According to Client, Did PCP Report Communication With An Aging Gracefully RN?: No  Clinician View of Client Situation: Clinician View Of Client Situation: Arrived to home for joint visit for Levi Turner and spouse. Levi Turner answered door. Ambulates independently without assistive device. Home neat, clean, and without clutter. Client's View of His/Her Situation: Client View Of His/Her Situation: Levi Turner reports he is feeling better and more motivated. States good reports from recent tests and warm weather contributes to his brighter mood. Reports he is looking forward to [facility] work to begin.  Medication Assessment: Reviewed    OT Update: Pending [facility] assessments and contracts.   Session Summary: Levi Turner is doing well overall. Reports he  has been enjoying the warm weather.    Goals Addressed               This Visit's Progress     AG RN (pt-stated)        07/15/23  Assessment: [name] reports no longer doing things he enjoyed most. Enjoyed fishing and hunting. Reports being mostly inactive. Does not have the motivation to organize or clean clutter areas in the basement and garage. Reports shoulder pain impedes a lot of activities for him. 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 Has had issues with right shoulder for years. States he enjoys yard work and being outside. However, states he lacks motivation to do much more than that.  Interventions: Encouraged Levi Turner to take advantage of his Silver [employer]neakers membership at [employer]. Brainstomed with [name] about possible activities at the [facility] he may possibly enjoy. Encouraged [name] to do one small activity at a time to begin decluttering and organizing. Provided chronic medical conditions notebook, calendar, and writer's contact information.   Plan: Scheduled next joint home visit for May 15th at 10 am.  CLIENT/RN ACTION PLAN - MOOD  Registered Nurse:  Nolberto Batty Date: 07/15/2023  Client Name:  Levi Turner Client ID:    Target Area:  MOOD   Why Problem May Occur: "after working for many years, now want to do what I want to do."    What Client would like to do for herself/himself: "would like to get more organized and become more active"  over the next 160 days.    STRATEGIES Why Do I Feel Blue: Ideas  Taking Care of Other People or Caregiver Fatigue  Reviewed 07/15/2023 Reviewed 08/20/2023 Do something nice for  yourself: Read a book Take a bath Garden Listen to music Write in a journal Write poetry   Diabetes  N/A Control your blood sugar. Take your diabetes medication. Avoid sweets.   Pain Reviewed 07/15/2023 Reviewed 08/20/2023 Control your pain. Pain can prevent you from being  social. Over the counter pain medicine can decrease pain. Keep moving, exercise can help pain. See Action Plan for Pain.   Medical Problems Reviewed 07/15/2023 Reviewed 08/20/2023  Some illnesses can cause depression. Depression can be a side effect of some medicines. Tell your Healthcare Provider if you have been feeling down.                      Coping Strategies Ideas  Prayer/Meditation Reviewed 07/15/2023 Reviewed 08/20/2023 Prayer can give comfort and hope which makes people feel better. Meditation can decrease anxiety and make you feel calm.     Social activities Reviewed 07/15/2023 Reviewed 08/20/2023 Go to church. See friends and family. Volunteer to help others. Call friends and family on the phone.   Exercise/Activity Reviewed 07/15/2023 Reviewed 08/20/2023 Aging Gracefully Exercises Walking (inside or outside) Dancing, swimming, gardening Make artwork or crafts:  painting, cards, scrapbooks Singing and listening to music   Talk or Write About Your Feelings Reviewed 07/15/2023 Reviewed 08/20/2023 See a therapist/counselor or support group. Talk to friends and family about your feelings. Write about your feelings in your journal.   Medicine Reviewed 07/15/2023 Reviewed 08/20/2023 Anti-depressants, there are a lot of different kinds to try. Pain medication, even over the counter pain medicine can control your pain better. Depression can be a side effect to some medications.  If you are feeling down tell your Healthcare Provider.   Prevention Ideas                  PRACTICE It is important to practice the strategies so we can determine if they will be effective in helping to reach the goal.    Follow these specific recommendations:        If strategy does not work the first time, try it again.     We may make some changes over the next few sessions.       Nolberto Batty, MSN, RN, BSN [place]  [facility], Healthy Communities RN Case  Manager for Aging Gracefully Direct Dial: [phone]    08/20/23  Assessment: Levi Turner reports he has been feeling a lot better as of late. States warm weather and good reports on recent tests has helped his mood a lot. Reports he has re-joined [employer] program but has not gone to gym yet. States he enjoys gardening, walking, and shopping. Reports shoulder pain is 8 out of 10.  Intervention: Performed home exercises and provided exercise booklet. Encouraged Mr. Sklar to attend [employer] program. Encouraged Levi Turner use hot or ice for shoulder pain. Encouraged Levi Turner to start and complete one small task at a time.   Plan: Scheduled next home visit on June 24th at 10 am.    Nolberto Batty, MSN, RN, BSN [place]  [facility], Healthy Communities RN Case Manager for Aging Gracefully Direct Dial: [phone]  Nolberto Batty, MSN, RN, BSN [place]  [facility], Healthy Communities RN Case Manager for Aging Gracefully Direct Dial: [phone]

## 2023-08-20 NOTE — Patient Instructions (Signed)
 Visit Information  Thank you for taking time to visit with me today. Please don't hesitate to contact me if I can be of assistance to you before our next scheduled home appointment.  Following are the goals we discussed today:   Goals Addressed               This Visit's Progress     AG RN (pt-stated)        07/15/23  Assessment: Levi Turner reports no longer doing things he enjoyed most. Enjoyed fishing and hunting. Reports being mostly inactive. Does not have the motivation to organize or clean clutter areas in the basement and garage. Reports shoulder pain impedes a lot of activities for him. 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 Has had issues with right shoulder for years. States he enjoys yard work and being outside. However, states he lacks motivation to do much more than that.  Interventions: Encouraged Levi Turner to take advantage of his Silver [employer]akers membership at [employer]. Brainstomed with Levi Turner about possible activities at the [facility] he may possibly enjoy. Encouraged Levi Turner to do one small activity at a time to begin decluttering and organizing. Provided chronic medical conditions notebook, calendar, and writer's contact information.   Plan: Scheduled next joint home visit for May 15th at 10 am.  CLIENT/RN ACTION PLAN - MOOD  Registered Nurse:  Nolberto Batty Date: 07/15/2023  Client Name:  Levi Turner Client ID:    Target Area:  MOOD   Why Problem May Occur: "after working for many years, now want to do what I want to do."    What Client would like to do for herself/himself: "would like to get more organized and become more active"  over the next 160 days.    STRATEGIES Why Do I Feel Blue: Ideas  Taking Care of Other People or Caregiver Fatigue  Reviewed 07/15/2023 Reviewed 08/20/2023 Do something nice for yourself: Read a book Take a bath Garden Listen to music Write in a journal Write poetry    Diabetes  N/A Control your blood sugar. Take your diabetes medication. Avoid sweets.   Pain Reviewed 07/15/2023 Reviewed 08/20/2023 Control your pain. Pain can prevent you from being social. Over the counter pain medicine can decrease pain. Keep moving, exercise can help pain. See Action Plan for Pain.   Medical Problems Reviewed 07/15/2023 Reviewed 08/20/2023  Some illnesses can cause depression. Depression can be a side effect of some medicines. Tell your Healthcare Provider if you have been feeling down.                      Coping Strategies Ideas  Prayer/Meditation Reviewed 07/15/2023 Reviewed 08/20/2023 Prayer can give comfort and hope which makes people feel better. Meditation can decrease anxiety and make you feel calm.     Social activities Reviewed 07/15/2023 Reviewed 08/20/2023 Go to church. See friends and family. Volunteer to help others. Call friends and family on the phone.   Exercise/Activity Reviewed 07/15/2023 Reviewed 08/20/2023 Aging Gracefully Exercises Walking (inside or outside) Dancing, swimming, gardening Make artwork or crafts:  painting, cards, scrapbooks Singing and listening to music   Talk or Write About Your Feelings Reviewed 07/15/2023 Reviewed 08/20/2023 See a therapist/counselor or support group. Talk to friends and family about your feelings. Write about your feelings in your journal.   Medicine Reviewed 07/15/2023 Reviewed 08/20/2023 Anti-depressants, there are a lot of different kinds to try. Pain medication, even over  the counter pain medicine can control your pain better. Depression can be a side effect to some medications.  If you are feeling down tell your Healthcare Provider.   Prevention Ideas                  PRACTICE It is important to practice the strategies so we can determine if they will be effective in helping to reach the goal.    Follow these specific recommendations:        If strategy does not work the first  time, try it again.     We may make some changes over the next few sessions.       Nolberto Batty, MSN, RN, BSN [place]  [facility], Healthy Communities RN Case Manager for Aging Gracefully Direct Dial: [phone]    08/20/23  Assessment: Levi Turner reports he has been feeling a lot better as of late. States warm weather and good reports on recent tests has helped his mood a lot. Reports he has re-joined [employer] program but has not gone to gym yet. States he enjoys gardening, walking, and shopping. Reports shoulder pain is 8 out of 10.  Intervention: Performed home exercises and provided exercise booklet. Encouraged Levi Turner to attend [employer] program. Encouraged [name] use hot or ice for shoulder pain. Encouraged [name] to start and complete one small task at a time.   Plan: Scheduled next home visit on June 24th at 10 am.    Nolberto Batty, MSN, RN, BSN [place]  [facility], Healthy Communities RN Case Manager for Aging Gracefully Direct Dial: [phone]                                                           Our next appointment is on at June 24th at 10am.   If you are experiencing a Mental Health or Behavioral Health Crisis or need someone to talk to, please call the Suicide and Crisis Lifeline: 988 call the [place]  National Suicide Prevention Lifeline: 530-366-9566 or TTY: (737) 374-0783 TTY 8131160649) to talk to a trained counselor call 1-800-273-TALK (toll free, 24 hour hotline) go to [facility] [address] [place] [phone]) call 911   The patient verbalized understanding of instructions, educational materials, and care plan provided today and agreed to receive a mailed copy of patient instructions, educational materials, and care plan.   Nolberto Batty, MSN, RN, BSN [place]  [facility],  Healthy Communities RN Case Manager for Aging Gracefully Direct Dial: [phone]

## 2023-09-17 DIAGNOSIS — E7800 Pure hypercholesterolemia, unspecified: Secondary | ICD-10-CM | POA: Diagnosis not present

## 2023-09-17 DIAGNOSIS — K5100 Ulcerative (chronic) pancolitis without complications: Secondary | ICD-10-CM | POA: Diagnosis not present

## 2023-09-17 DIAGNOSIS — N400 Enlarged prostate without lower urinary tract symptoms: Secondary | ICD-10-CM | POA: Diagnosis not present

## 2023-09-17 DIAGNOSIS — D84821 Immunodeficiency due to drugs: Secondary | ICD-10-CM | POA: Diagnosis not present

## 2023-09-17 DIAGNOSIS — I10 Essential (primary) hypertension: Secondary | ICD-10-CM | POA: Diagnosis not present

## 2023-09-17 DIAGNOSIS — N529 Male erectile dysfunction, unspecified: Secondary | ICD-10-CM | POA: Diagnosis not present

## 2023-09-17 DIAGNOSIS — Z6824 Body mass index (BMI) 24.0-24.9, adult: Secondary | ICD-10-CM | POA: Diagnosis not present

## 2023-09-17 DIAGNOSIS — Z0000 Encounter for general adult medical examination without abnormal findings: Secondary | ICD-10-CM | POA: Diagnosis not present

## 2023-09-17 DIAGNOSIS — F411 Generalized anxiety disorder: Secondary | ICD-10-CM | POA: Diagnosis not present

## 2023-09-22 DIAGNOSIS — R3121 Asymptomatic microscopic hematuria: Secondary | ICD-10-CM | POA: Diagnosis not present

## 2023-09-22 DIAGNOSIS — N4232 Atypical small acinar proliferation of prostate: Secondary | ICD-10-CM | POA: Diagnosis not present

## 2023-09-22 DIAGNOSIS — R972 Elevated prostate specific antigen [PSA]: Secondary | ICD-10-CM | POA: Diagnosis not present

## 2023-09-29 ENCOUNTER — Other Ambulatory Visit: Payer: Self-pay | Admitting: *Deleted

## 2023-09-29 ENCOUNTER — Encounter: Payer: Self-pay | Admitting: *Deleted

## 2023-09-29 NOTE — Patient Outreach (Signed)
 Aging Gracefully Program  RN Visit  09/29/2023  GARLEN REINIG 15-Jan-1951 [id number]  Visit:  RN Visit Number: 3- Third Visit  RN TIME CALCULATION: Start TIme:  RN Start Time Calculation: 1000 End Time:  RN Stop Time Calculation: 1135 Total Minutes:  RN Time Calculation: 95  Readiness To Change Score:  Readiness to Change Score: 9.33  Universal RN Interventions: Calendar Distribution: No Exercise Review: Yes Medications: Yes Medication Changes: No Mood: Yes Pain: Yes PCP Advocacy/Support: Yes Fall Prevention: Yes Incontinence: Yes Clinician View Of Client Situation: Arrived for joint home visit for Mr. Deemer and wife. Mr. Hyams wife answered door. [name] sitting on couch in living room. Home clean throughout home. [name] ambulates independently without assistive device. [facility] completed roof today. Client View Of His/Her Situation: [name] reports doing well overall. States he is happy to have received a good bill of health from his urologist.  Healthcare Provider Communication: Did Surveyor, mining With [employer] Provider?: No Healthcare Provider Response According to RN: N/A According to Client, Did PCP Report Communication With An Aging Gracefully RN?: No Healthcare Provider Response According To Client: N/A  Clinician View of Client Situation: Clinician View Of Client Situation: Arrived for joint home visit for Mr. Pew and wife. Mr. Duplantis wife answered door. [name] sitting on couch in living room. Home clean throughout home. [name] ambulates independently without assistive device. [facility] completed roof today. Client's View of His/Her Situation: Client View Of His/Her Situation: [name] reports doing well overall. States he is happy to have received a good bill of health from his urologist.  Medication Assessment: Reviewed    OT Update: Pending [facility] contracts and assessments  Session Summary: Mr.  Dougal is pleasant and receptive to different strategies. States he plans to figure out what motivates him. Mrs. Wardworth mentioned getting them a dog. Mr. Muto liked the idea. Denies any concerns during home visit.    Goals Addressed               This Visit's Progress     AG RN (pt-stated)        07/15/23  Assessment: Mr. Caselli reports no longer doing things he enjoyed most. Enjoyed fishing and hunting. Reports being mostly inactive. Does not have the motivation to organize or clean clutter areas in the basement and garage. Reports shoulder pain impedes a lot of activities for him. 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 Has had issues with right shoulder for years. States he enjoys yard work and being outside. However, states he lacks motivation to do much more than that.  Interventions: Encouraged Mr. Gongaware to take advantage of his Silver Sneakers membership at [employer]. Brainstomed with Mr. Zion about possible activities at the [facility] he may possibly enjoy. Encouraged Mr. Jeudy to do one small activity at a time to begin decluttering and organizing. Provided chronic medical conditions notebook, calendar, and writer's contact information.   Plan: Scheduled next joint home visit for May 15th at 10 am.  CLIENT/RN ACTION PLAN - MOOD  Registered Nurse:  Pablo Hurst Date: 07/15/2023  Client Name:  [place] Client ID:    Target Area:  MOOD   Why Problem May Occur: after working for many years, now want to do what I want to do.    What Client would like to do for herself/himself: would like to get more organized and become more active  over the next 160 days.  STRATEGIES Why Do I Feel Blue: Ideas  Taking Care of Other People or Caregiver Fatigue  Reviewed 07/15/2023 Reviewed 08/20/2023 Reviewed 09/29/2023 Do something nice for yourself: Read a book Take a bath Garden Listen to music Write in a  journal Write poetry   Diabetes  N/A Control your blood sugar. Take your diabetes medication. Avoid sweets.   Pain Reviewed 07/15/2023 Reviewed 08/20/2023 Reviewed 09/29/2023 Control your pain. Pain can prevent you from being social. Over the counter pain medicine can decrease pain. Keep moving, exercise can help pain. See Action Plan for Pain.   Medical Problems Reviewed 07/15/2023 Reviewed 08/20/2023 Reviewed 09/29/2023  Some illnesses can cause depression. Depression can be a side effect of some medicines. Tell your Healthcare Provider if you have been feeling down.                      Coping Strategies Ideas  Prayer/Meditation Reviewed 07/15/2023 Reviewed 08/20/2023 Reviewed 09/29/2023 Prayer can give comfort and hope which makes people feel better. Meditation can decrease anxiety and make you feel calm.     Social activities Reviewed 07/15/2023 Reviewed 08/20/2023 Reviewed 09/29/2023 Go to church. See friends and family. Volunteer to help others. Call friends and family on the phone.   Exercise/Activity Reviewed 07/15/2023 Reviewed 08/20/2023 Reviewed 09/29/2023 Aging Gracefully Exercises Walking (inside or outside) Dancing, swimming, gardening Make artwork or crafts:  painting, cards, scrapbooks Singing and listening to music   Talk or Write About Your Feelings Reviewed 07/15/2023 Reviewed 08/20/2023 Reviewed 09/29/2023 See a therapist/counselor or support group. Talk to friends and family about your feelings. Write about your feelings in your journal.   Medicine Reviewed 07/15/2023 Reviewed 08/20/2023 Reviewed 09/29/2023 Anti-depressants, there are a lot of different kinds to try. Pain medication, even over the counter pain medicine can control your pain better. Depression can be a side effect to some medications.  If you are feeling down tell your Healthcare Provider.   Prevention Ideas                  PRACTICE It is important to practice the strategies so  we can determine if they will be effective in helping to reach the goal.    Follow these specific recommendations:        If strategy does not work the first time, try it again.     We may make some changes over the next few sessions.       Pablo Hurst, MSN, RN, BSN [place]  [facility], Healthy Communities RN Case Manager for Aging Gracefully Direct Dial: [phone]    08/20/23  Assessment: Mr. Degrace reports he has been feeling a lot better as of late. States warm weather and good reports on recent tests has helped his mood a lot. Reports he has re-joined [employer] program but has not gone to gym yet. States he enjoys gardening, walking, and shopping. Reports shoulder pain is 8 out of 10.  Intervention: Performed home exercises and provided exercise booklet. Encouraged Mr. Defina to attend [employer] Sneakers program. Encouraged [name] use hot or ice for shoulder pain. Encouraged [name] to start and complete one small task at a time.   Plan: Scheduled next home visit on June 24th at 10 am.    Pablo Hurst, MSN, RN, BSN [facility], Healthy Communities RN Case Manager for Aging Gracefully Direct Dial: [phone]    09/29/2023  Assessment: Mr. Luczynski reports he has been doing well  overall. States he is still working on de-cluttering areas in his area in the home. States he has not started yet. Reports his blood pressure have been elevated lately. Reports keeping track of his blood pressure to tell the doctor. Denies any chest pain, dizziness, headache, or chest pain. Reports not enjoying social situations. Reports feeling anxious in social settings. States he performs home exercises.  Interventions: Encouraged Mr. Nardozzi to call PCP with increased blood pressures. Encouraged him to continue to log blood pressure readings. Discussed signs and symptoms of stroke and when to call 911. Discussed low salt  intake, decrease stress/anxiety, exercise, and decreasing caffeine intake are a few ways to assist in blood pressure management. Sent EMMI blood pressure and anxiety programs to Mr. Seaman email. Encouraged Mr. Kornegay to seek counseling/therapy. Encouraged him to contact his [facility] concierge line to inquire about counseling/therapy services. Encouraged Mr. Verga to practice deep breathing exercises, journal, participate in activities he enjoys, such as gardening. Commended [name] for doing his home exercises.  Plan: Scheduled next home visit for 11/05/23  Pablo Hurst, MSN, RN, BSN [place]  [facility], Healthy Communities RN Case Manager for Aging Gracefully Direct Dial: [phone]

## 2023-09-29 NOTE — Patient Instructions (Signed)
 Visit Information  Thank you for taking time to visit with me today. Please don't hesitate to contact me if I can be of assistance to you before our next scheduled home appointment.  Following are the goals we discussed today:   Goals Addressed               This Visit's Progress     AG RN (pt-stated)        07/15/23  Assessment: Levi Levi Turner reports no longer doing things he enjoyed most. Enjoyed fishing and hunting. Reports being mostly inactive. Does not have the motivation to organize or clean clutter areas in the basement and garage. Reports shoulder pain impedes a lot of activities for him. 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 Has had issues with right shoulder for years. States he enjoys yard work and being outside. However, states he lacks motivation to do much more than that.  Interventions: Encouraged Levi Levi Turner to take advantage of his Silver Sneakers membership at [employer]. Brainstomed with Levi Levi Turner about possible activities at the [facility] he may possibly enjoy. Encouraged Levi Levi Turner to do one small activity at a time to begin decluttering and organizing. Provided chronic medical conditions notebook, calendar, and writer's contact information.   Plan: Scheduled next joint home visit for May 15th at 10 am.  CLIENT/RN ACTION PLAN - MOOD  Registered Nurse:  Pablo Hurst Date: 07/15/2023  Client Name:  Levi Levi Turner Client ID:    Target Area:  MOOD   Why Problem May Occur: after working for many years, now want to do what I want to do.    What Client would like to do for herself/himself: would like to get more organized and become more active  over the next 160 days.    STRATEGIES Why Do I Feel Blue: Ideas  Taking Care of Other People or Caregiver Fatigue  Reviewed 07/15/2023 Reviewed 08/20/2023 Reviewed 09/29/2023 Do something nice for yourself: Read a book Take a bath Garden Listen to music Write in a  journal Write poetry   Diabetes  N/A Control your blood sugar. Take your diabetes medication. Avoid sweets.   Pain Reviewed 07/15/2023 Reviewed 08/20/2023 Reviewed 09/29/2023 Control your pain. Pain can prevent you from being social. Over the counter pain medicine can decrease pain. Keep moving, exercise can help pain. See Action Plan for Pain.   Medical Problems Reviewed 07/15/2023 Reviewed 08/20/2023 Reviewed 09/29/2023  Some illnesses can cause depression. Depression can be a side effect of some medicines. Tell your Healthcare Provider if you have been feeling down.                      Coping Strategies Ideas  Prayer/Meditation Reviewed 07/15/2023 Reviewed 08/20/2023 Reviewed 09/29/2023 Prayer can give comfort and hope which makes people feel better. Meditation can decrease anxiety and make you feel calm.     Social activities Reviewed 07/15/2023 Reviewed 08/20/2023 Reviewed 09/29/2023 Go to church. See friends and family. Volunteer to help others. Call friends and family on the phone.   Exercise/Activity Reviewed 07/15/2023 Reviewed 08/20/2023 Reviewed 09/29/2023 Aging Gracefully Exercises Walking (inside or outside) Dancing, swimming, gardening Make artwork or crafts:  painting, cards, scrapbooks Singing and listening to music   Talk or Write About Your Feelings Reviewed 07/15/2023 Reviewed 08/20/2023 Reviewed 09/29/2023 See a therapist/counselor or support group. Talk to friends and family about your feelings. Write about your feelings in your journal.   Medicine Reviewed 07/15/2023 Reviewed 08/20/2023  Reviewed 09/29/2023 Anti-depressants, there are a lot of different kinds to try. Pain medication, even over the counter pain medicine can control your pain better. Depression can be a side effect to some medications.  If you are feeling down tell your Healthcare Provider.   Prevention Ideas                  PRACTICE It is important to practice the strategies so  we can determine if they will be effective in helping to reach the goal.    Follow these specific recommendations:        If strategy does not work the first time, try it again.     We may make some changes over the next few sessions.       Pablo Hurst, MSN, RN, BSN [place]  [facility], Healthy Communities RN Case Manager for Aging Gracefully Direct Dial: [phone]    08/20/23  Assessment: Levi Levi Turner reports he has been feeling a lot better as of late. States warm weather and good reports on recent tests has helped his mood a lot. Reports he has re-joined [employer] program but has not gone to gym yet. States he enjoys gardening, walking, and shopping. Reports shoulder pain is 8 out of 10.  Intervention: Performed home exercises and provided exercise booklet. Encouraged Levi Levi Turner to attend [employer] program. Encouraged Levi Levi Turner use hot or ice for shoulder pain. Encouraged Levi Levi Turner to start and complete one small task at a time.   Plan: Scheduled next home visit on June 24th at 10 am.    Pablo Hurst, MSN, RN, BSN [place]  [facility], Healthy Communities RN Case Manager for Aging Gracefully Direct Dial: [phone]    09/29/2023  Assessment: Levi Levi Turner reports he has been doing well overall. States he is still working on de-cluttering areas in his area in the home. States he has not started yet. Reports his blood pressure have been elevated lately. Reports keeping track of his blood pressure to tell the doctor. Denies any chest pain, dizziness, headache, or chest pain. Reports not enjoying social situations. Reports feeling anxious in social settings. States he performs home exercises.  Interventions: Encouraged Levi Levi Turner to call PCP with increased blood pressures. Encouraged him to continue to log blood pressure readings. Discussed signs and symptoms of stroke and when to call 911. Discussed low salt  intake, decrease stress/anxiety, exercise, and decreasing caffeine intake are a few ways to assist in blood pressure management. Sent EMMI blood pressure and anxiety programs to Levi Levi Turner email. Encouraged Levi Levi Turner to seek counseling/therapy. Encouraged him to contact his HTA concierge line to inquire about counseling/therapy services. Encouraged Mr. Mcconaha to practice deep breathing exercises, journal, participate in activities he enjoys, such as gardening. Commended Levi Levi Turner for doing his home exercises. Advised Mr. Pecina to limit time outdoors and to stay hydrated during this time of increased temperatures.   Plan: Scheduled next home visit for 11/05/23  Pablo Hurst, MSN, RN, BSN   [facility], Healthy Communities RN Case Manager for Aging Gracefully Direct Dial: [phone]  Our next appointment is on July 31 at 10 am  If you are experiencing a Mental Health or Behavioral Health Crisis or need someone to talk to, please call the Suicide and Crisis Lifeline: 988 call the [place]  National Suicide Prevention Lifeline: 650-191-1469 or TTY: 807-399-9515 TTY 825 700 9559) to talk to a trained counselor call 1-800-273-TALK (toll free, 24 hour hotline) go to [facility] [address], [place] [phone]) call 911   The patient verbalized understanding of instructions, educational materials, and care plan provided today and agreed to receive a mailed copy of patient instructions, educational materials, and care plan.   Pablo Hurst, MSN, RN, BSN [place]  [facility], Healthy Communities RN Case Manager for Aging Gracefully Direct Dial: [phone]

## 2023-10-06 DIAGNOSIS — H2513 Age-related nuclear cataract, bilateral: Secondary | ICD-10-CM | POA: Diagnosis not present

## 2023-10-06 DIAGNOSIS — H40033 Anatomical narrow angle, bilateral: Secondary | ICD-10-CM | POA: Diagnosis not present

## 2023-10-17 DIAGNOSIS — B029 Zoster without complications: Secondary | ICD-10-CM | POA: Diagnosis not present

## 2023-11-04 ENCOUNTER — Encounter: Admitting: *Deleted

## 2023-11-05 ENCOUNTER — Encounter: Payer: Self-pay | Admitting: *Deleted

## 2023-11-05 ENCOUNTER — Other Ambulatory Visit: Payer: Self-pay | Admitting: *Deleted

## 2023-11-05 ENCOUNTER — Encounter: Admitting: *Deleted

## 2023-11-05 NOTE — Patient Outreach (Signed)
 Aging Gracefully Program  RN Visit  11/05/2023  Levi Turner Sep 05, 1950 [id number]  Visit:  RN Visit Number: 4- Fourth Visit  RN TIME CALCULATION: Start TIme:  RN Start Time Calculation: 1100 End Time:  RN Stop Time Calculation: 1330 Total Minutes:  RN Time Calculation: 150  Readiness To Change Score:  Readiness to Change Score: 10  Universal RN Interventions: Calendar Distribution: No Exercise Review: Yes Medications: Yes Medication Changes: No Mood: Yes Pain: Yes PCP Advocacy/Support: No Fall Prevention: Yes Incontinence: Yes Clinician View Of Levi Situation: Arrived for joint home visit for Levi Turner and wife. CHS present in the home working on repairs and home modifications. Furniture rearranged due to repair work. Levi Turner ambulates independently without assistive device. 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 In good spirits.  Healthcare Provider Communication: Did Surveyor, mining With [employer] Provider?: No Healthcare Provider Response According to RN: n/a According to Levi, Did PCP Report Communication With An Aging Gracefully RN?: No Healthcare Provider Response According To Levi: n/a  Clinician View of Levi Situation: Clinician View Of Levi Situation: Arrived for joint home visit for Levi Turner and wife. CHS present in the home working on repairs and home modifications. Furniture rearranged due to repair work. Levi Turner ambulates independently without assistive device. 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 In good spirits.  Medication Assessment: Reviewed    OT Update: Pending CHS completion  Session Summary: Levi Turner reports being extremely pleased with the Aging Gracefully program  and team. Expresses appreciation. He is doing well overall.   Goals Addressed               This Visit's Progress     COMPLETED: AG RN (pt-stated)        07/15/23  Assessment: Levi Turner reports no longer doing things he enjoyed most. Enjoyed fishing and hunting. Reports being mostly inactive. Does not have the motivation to organize or clean clutter areas in the basement and garage. Reports shoulder pain impedes a lot of activities for him. 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 Has had issues with right shoulder for years. States he enjoys yard work and being outside. However, states he lacks motivation to do much more than that.  Interventions: Encouraged Levi Turner to take advantage of his Silver [employer]neakers membership at [employer]. Brainstomed with Levi Turner about possible activities at the [facility] he may possibly enjoy. Encouraged Levi Turner to do one small activity at a time to begin decluttering and organizing. Provided chronic medical conditions notebook, calendar, and writer's contact information.   Plan: Scheduled next joint home visit for May 15th at 10 am.  Levi/RN ACTION PLAN - MOOD  Registered Nurse:  Pablo Hurst Date: 07/15/2023  Levi Name:  Levi Turner Levi ID:    Target Area:  MOOD   Why Problem May Occur: after working for many years, now want to do what I want to do.    What Levi would like to do for herself/himself: would like to get more organized and become more active  over the next 160 days.    STRATEGIES Why Do I Feel Blue: Ideas  Taking Care of Other People or Caregiver Fatigue  Reviewed 07/15/2023 Reviewed 08/20/2023 Reviewed 09/29/2023 Reviewed 11/05/23 Do something  nice for yourself: Read a book Take a bath Garden Listen to music Write in a journal Write poetry   Diabetes  N/A Control your blood sugar. Take your diabetes medication. Avoid sweets.   Pain Reviewed  07/15/2023 Reviewed 08/20/2023 Reviewed 09/29/2023 Reviewed 11/05/23 Control your pain. Pain can prevent you from being social. Over the counter pain medicine can decrease pain. Keep moving, exercise can help pain. See Action Plan for Pain.   Medical Problems Reviewed 07/15/2023 Reviewed 08/20/2023 Reviewed 09/29/2023 Reviewed 11/05/23  Some illnesses can cause depression. Depression can be a side effect of some medicines. Tell your Healthcare Provider if you have been feeling down.                      Coping Strategies Ideas  Prayer/Meditation Reviewed 07/15/2023 Reviewed 08/20/2023 Reviewed 09/29/2023 Reviewed 11/05/23 Prayer can give comfort and hope which makes people feel better. Meditation can decrease anxiety and make you feel calm.     Social activities Reviewed 07/15/2023 Reviewed 08/20/2023 Reviewed 09/29/2023 Reviewed 11/05/23 Go to church. See friends and family. Volunteer to help others. Call friends and family on the phone.   Exercise/Activity Reviewed 07/15/2023 Reviewed 08/20/2023 Reviewed 09/29/2023 Reviewed 11/05/23 Aging Gracefully Exercises Walking (inside or outside) Dancing, swimming, gardening Make artwork or crafts:  painting, cards, scrapbooks Singing and listening to music   Talk or Write About Your Feelings Reviewed 07/15/2023 Reviewed 08/20/2023 Reviewed 09/29/2023 Reviewed 11/05/23 See a therapist/counselor or support group. Talk to friends and family about your feelings. Write about your feelings in your journal.   Medicine Reviewed 07/15/2023 Reviewed 08/20/2023 Reviewed 09/29/2023 Reviewed 11/05/23 Anti-depressants, there are a lot of different kinds to try. Pain medication, even over the counter pain medicine can control your pain better. Depression can be a side effect to some medications.  If you are feeling down tell your Healthcare Provider.   Prevention Ideas                  PRACTICE It is important to practice the strategies so we can  determine if they will be effective in helping to reach the goal.    Follow these specific recommendations:        If strategy does not work the first time, try it again.     We may make some changes over the next few sessions.       Pablo Hurst, MSN, RN, BSN [place]  [facility], Healthy Communities RN Case Manager for Aging Gracefully Direct Dial: [phone]    08/20/23  Assessment: Mr. Couey reports he has been feeling a lot better as of late. States warm weather and good reports on recent tests has helped his mood a lot. Reports he has re-joined [employer] program but has not gone to gym yet. States he enjoys gardening, walking, and shopping. Reports shoulder pain is 8 out of 10.  Intervention: Performed home exercises and provided exercise booklet. Encouraged Mr. Blaisdell to attend Silver Sneakers program. Encouraged [name] use hot or ice for shoulder pain. Encouraged [name] to start and complete one small task at a time.   Plan: Scheduled next home visit on June 24th at 10 am.    Pablo Hurst, MSN, RN, BSN [place]  [facility], Healthy Communities RN Case Manager for Aging Gracefully Direct Dial: [phone]    09/29/2023  Assessment: Mr. Laster reports he has been doing well overall. States he is still working on de-cluttering areas in his  area in the home. States he has not started yet. Reports his blood pressure have been elevated lately. Reports keeping track of his blood pressure to tell the doctor. Denies any chest pain, dizziness, headache, or chest pain. Reports not enjoying social situations. Reports feeling anxious in social settings. States he performs home exercises.  Interventions: Encouraged Mr. Curro to call PCP with increased blood pressures. Encouraged him to continue to log blood pressure readings. Discussed signs and symptoms of stroke and when to call 911. Discussed low salt intake,  decrease stress/anxiety, exercise, and decreasing caffeine intake are a few ways to assist in blood pressure management. Sent EMMI blood pressure and anxiety programs to Mr. Palin email. Encouraged Mr. Nestor to seek counseling/therapy. Encouraged him to contact his HTA concierge line to inquire about counseling/therapy services. Encouraged Mr. Wintle to practice deep breathing exercises, journal, participate in activities he enjoys, such as gardening. Commended [name] for doing his home exercises. Advised Mr. Zeien to limit time outdoors and to stay hydrated during this time of increased temperatures.   Plan: Scheduled next home visit for 11/05/23 at 10 am.  Pablo Hurst, MSN, RN, BSN [place]  [facility], Healthy Communities RN Case Manager for Aging Gracefully Direct Dial: [phone]      11/05/23  Assessment: [name] reports having shingles a couple of weeks ago. States he was seen at [facility] and was provided treatment that has now concluded. States he recovered from Shingles quickly. States he is feeling good. Denies pain. Reports being more active lately. States he has been doing more around the house by reorganizing since home repairs are underway by [facility]. Mr. Arden expresses appreciation of Aging Gracefully program and team. States BP has not been running high anymore.  Interventions: Encouraged Mr. Richens to keep up the great work by being more active around the home. Encouraged Mr. Shannahan to journal. Encouraged Mr. Ramseyer to stay hydrated when doing outside garden and yard work. Discussed writer's last visit is today. Discussed OT will visit after CHS work is completed.  Plan: Will alert AG team of writer's last home visit.

## 2023-11-05 NOTE — Patient Instructions (Signed)
 Visit Information  Thank you for taking time to visit with me today. Please don't hesitate to contact me if I can be of assistance to you before our next scheduled home appointment.  Following are the goals we discussed today:   Goals Addressed               This Visit's Progress     COMPLETED: AG RN (pt-stated)        07/15/23  Assessment: Levi Turner reports no longer doing things he enjoyed most. Enjoyed fishing and hunting. Reports being mostly inactive. Does not have the motivation to organize or clean clutter areas in the basement and garage. Reports shoulder pain impedes a lot of activities for him. 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 Has had issues with right shoulder for years. States he enjoys yard work and being outside. However, states he lacks motivation to do much more than that.  Interventions: Encouraged Levi Turner to take advantage of his Silver Sneakers membership at [employer]. Brainstomed with Levi Turner about possible activities at the [facility] he may possibly enjoy. Encouraged Levi Turner to do one small activity at a time to begin decluttering and organizing. Provided chronic medical conditions notebook, calendar, and writer's contact information.   Plan: Scheduled next joint home visit for May 15th at 10 am.  CLIENT/RN ACTION PLAN - MOOD  Registered Nurse:  Pablo Hurst Date: 07/15/2023  Client Name:  Levi Turner Client ID:    Target Area:  MOOD   Why Problem May Occur: after working for many years, now want to do what I want to do.    What Client would like to do for herself/himself: would like to get more organized and become more active  over the next 160 days.    STRATEGIES Why Do I Feel Blue: Ideas  Taking Care of Other People or Caregiver Fatigue  Reviewed 07/15/2023 Reviewed 08/20/2023 Reviewed 09/29/2023 Reviewed 11/05/23 Do something nice for yourself: Read a book Take a bath Garden Listen to  music Write in a journal Write poetry   Diabetes  N/A Control your blood sugar. Take your diabetes medication. Avoid sweets.   Pain Reviewed 07/15/2023 Reviewed 08/20/2023 Reviewed 09/29/2023 Reviewed 11/05/23 Control your pain. Pain can prevent you from being social. Over the counter pain medicine can decrease pain. Keep moving, exercise can help pain. See Action Plan for Pain.   Medical Problems Reviewed 07/15/2023 Reviewed 08/20/2023 Reviewed 09/29/2023 Reviewed 11/05/23  Some illnesses can cause depression. Depression can be a side effect of some medicines. Tell your Healthcare Provider if you have been feeling down.                      Coping Strategies Ideas  Prayer/Meditation Reviewed 07/15/2023 Reviewed 08/20/2023 Reviewed 09/29/2023 Reviewed 11/05/23 Prayer can give comfort and hope which makes people feel better. Meditation can decrease anxiety and make you feel calm.     Social activities Reviewed 07/15/2023 Reviewed 08/20/2023 Reviewed 09/29/2023 Reviewed 11/05/23 Go to church. See friends and family. Volunteer to help others. Call friends and family on the phone.   Exercise/Activity Reviewed 07/15/2023 Reviewed 08/20/2023 Reviewed 09/29/2023 Reviewed 11/05/23 Aging Gracefully Exercises Walking (inside or outside) Dancing, swimming, gardening Make artwork or crafts:  painting, cards, scrapbooks Singing and listening to music   Talk or Write About Your Feelings Reviewed 07/15/2023 Reviewed 08/20/2023 Reviewed 09/29/2023 Reviewed 11/05/23 See a therapist/counselor or support group. Talk to friends and family about your  feelings. Write about your feelings in your journal.   Medicine Reviewed 07/15/2023 Reviewed 08/20/2023 Reviewed 09/29/2023 Reviewed 11/05/23 Anti-depressants, there are a lot of different kinds to try. Pain medication, even over the counter pain medicine can control your pain better. Depression can be a side effect to some medications.  If you are  feeling down tell your Healthcare Provider.   Prevention Ideas                  PRACTICE It is important to practice the strategies so we can determine if they will be effective in helping to reach the goal.    Follow these specific recommendations:        If strategy does not work the first time, try it again.     We may make some changes over the next few sessions.       Pablo Hurst, MSN, RN, BSN [place]  [facility], Healthy Communities RN Case Manager for Aging Gracefully Direct Dial: [phone]    08/20/23  Assessment: Levi Turner reports he has been feeling a lot better as of late. States warm weather and good reports on recent tests has helped his mood a lot. Reports he has re-joined [employer] program but has not gone to gym yet. States he enjoys gardening, walking, and shopping. Reports shoulder pain is 8 out of 10.  Intervention: Performed home exercises and provided exercise booklet. Encouraged Levi Turner to attend [employer] program. Encouraged Levi Turner use hot or ice for shoulder pain. Encouraged Levi Turner to start and complete one small task at a time.   Plan: Scheduled next home visit on June 24th at 10 am.    Pablo Hurst, MSN, RN, BSN [place]  [facility], Healthy Communities RN Case Manager for Aging Gracefully Direct Dial: [phone]    09/29/2023  Assessment: Levi Turner reports he has been doing well overall. States he is still working on de-cluttering areas in his area in the home. States he has not started yet. Reports his blood pressure have been elevated lately. Reports keeping track of his blood pressure to tell the doctor. Denies any chest pain, dizziness, headache, or chest pain. Reports not enjoying social situations. Reports feeling anxious in social settings. States he performs home exercises.  Interventions: Encouraged Levi Turner to call PCP with increased blood  pressures. Encouraged him to continue to log blood pressure readings. Discussed signs and symptoms of stroke and when to call 911. Discussed low salt intake, decrease stress/anxiety, exercise, and decreasing caffeine intake are a few ways to assist in blood pressure management. Sent EMMI blood pressure and anxiety programs to Levi Turner email. Encouraged Levi Turner to seek counseling/therapy. Encouraged him to contact his HTA concierge line to inquire about counseling/therapy services. Encouraged Levi Turner to practice deep breathing exercises, journal, participate in activities he enjoys, such as gardening. Commended Levi Turner for doing his home exercises. Advised Levi Turner to limit time outdoors and to stay hydrated during this time of increased temperatures.   Plan: Scheduled next home visit for 11/05/23 at 10 am.  Pablo Hurst, MSN, RN, BSN [place]  [facility], Healthy Communities RN Case Manager for Aging Gracefully Direct Dial: [phone]      11/05/23  Assessment: Levi Turner reports having shingles a couple of weeks ago. States he was seen at [facility] and was provided treatment that has now concluded. States he recovered from Shingles quickly. States he is feeling good. Denies  pain. Reports being more active lately. States he has been doing more around the house by reorganizing since home repairs are underway by [facility]. Levi Turner expresses appreciation of Aging Gracefully program and team. States BP has not been running high anymore.  Interventions: Encouraged Levi Turner to keep up the great work by being more active around the home. Encouraged Levi Turner to journal. Encouraged Levi Turner to stay hydrated when doing outside garden and yard work. Discussed writer's last visit is today. Discussed OT will visit after CHS work is completed.  Plan: Will alert AG team of writer's last home visit.                                                                               If you are experiencing a Mental Health or Behavioral Health Crisis or need someone to talk to, please call the Suicide and Crisis Lifeline: 988 call the [place]  National Suicide Prevention Lifeline: 386-782-6459 or TTY: 918 867 9891 TTY 203-764-9269) to talk to a trained counselor call 1-800-273-TALK (toll free, 24 hour hotline) go to [facility] [address] [place] [phone]) call 911   The patient verbalized understanding of instructions, educational materials, and care plan provided today and agreed to receive a mailed copy of patient instructions, educational materials, and care plan.   Pablo Hurst, MSN, RN, BSN [place]  [facility], Healthy Communities RN Case Manager for Aging Gracefully Direct Dial: [phone]

## 2023-11-12 DIAGNOSIS — K5100 Ulcerative (chronic) pancolitis without complications: Secondary | ICD-10-CM | POA: Diagnosis not present

## 2023-11-14 ENCOUNTER — Other Ambulatory Visit: Payer: Self-pay | Admitting: Occupational Therapy

## 2023-11-15 NOTE — Patient Outreach (Signed)
 Aging Gracefully Program  OT Follow-Up Visit  11/15/2023  Levi Turner 1950/08/02 [id number]  Visit:  3- Third Visit  Start Time:  1431 End Time:  1500 Total Minutes:  29  Readiness to Change Score :  Readiness to Change Score: 10  Goals:   Goals Addressed               This Visit's Progress     COMPLETED: Patient Stated (pt-stated)        Safety up and down basement step. A hand rail on the right as you go down the steps would help.MET  Name: Kate Sweetman   Date: 11/14/2023   OT ACTION PLAN: Functional Mobility   Target Problem Area:   Decreased safety into and out of the basement due to steps and only one hand rail    Why Problem May Occur:   Decreased balance  Pain   Target Goal(s):   Safety and independence getting into and out of the basement    STRATEGIES   Modifying your home environment and making it safe     DO:  A second handrail put on the basement steps  Good lighting at steps    PRACTICE  Based on what we have talked about, you are willing to try:   TO USE BOTH RAILS WHEN GOING UP AND DOWN THE BASEMENT STEPS   If an idea does not work the first time, try it again (and again).  We may make some changes over the next few sessions, based on how they work.   [name]        11/14/2023 Occupational Therapist                        Date        Post Clinical Reasoning: Client Action (Goal) One Interventions: rail down basement steps and outside walkway with 2 rails Did Client Try?: Yes (he has been using them since they were installed) Targeted Problem Area Status: A Lot Better Clinician View Of Client Situation:: [name] is much safer with all 3 rails that were installed Client View Of His/Her Situation:: He is really pleased with the new rails Next Visit Plan:: FInish up education and follow up on a couple of items still left to be completed  [place], [place] Aging Gracefully [phone]

## 2023-11-15 NOTE — Patient Instructions (Signed)
 Name: Levi Turner   Date: 11/14/2023   OT ACTION PLAN: Functional Mobility   Target Problem Area:   Decreased safety into and out of the basement due to steps and only one hand rail    Why Problem May Occur:   Decreased balance  Pain   Target Goal(s):   Safety and independence getting into and out of the basement    STRATEGIES   Modifying your home environment and making it safe     DO:  A second handrail put on the basement steps  Good lighting at steps    PRACTICE  Based on what we have talked about, you are willing to try:   TO USE BOTH RAILS WHEN GOING UP AND DOWN THE BASEMENT STEPS   If an idea does not work the first time, try it again (and again).  We may make some changes over the next few sessions, based on how they work.   [name]        11/14/2023 Occupational Therapist                        Date

## 2023-12-02 ENCOUNTER — Other Ambulatory Visit: Payer: Self-pay | Admitting: Occupational Therapy

## 2023-12-08 IMAGING — DX DG HUMERUS 2V *R*
1 series · 2 of 2 positions shown · non-contrast
Comparison: 05/15/2011

CLINICAL DATA: Status post removal of right shoulder infected
reverse arthroplasty with I and D.

EXAM:
RIGHT HUMERUS - 2+ VIEW

[Series 1: humerus ap · 0.14mm/px · 2 of 2 slices shown]
[im 1/2]
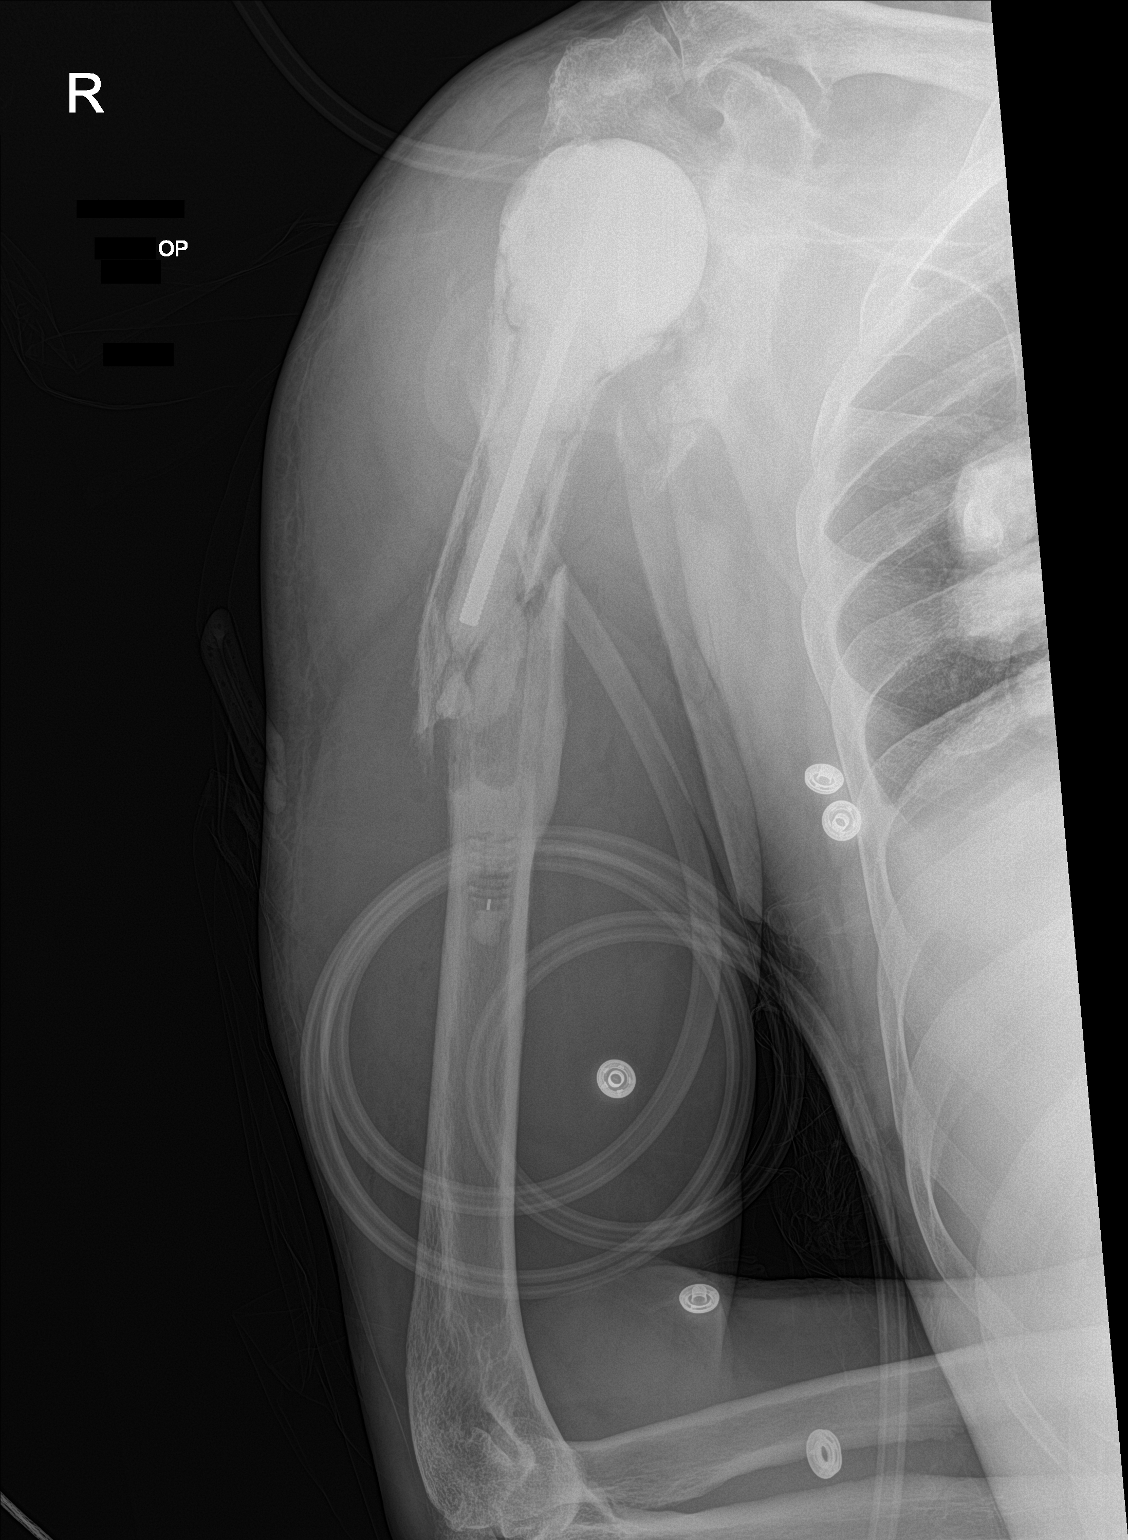
[im 2/2]
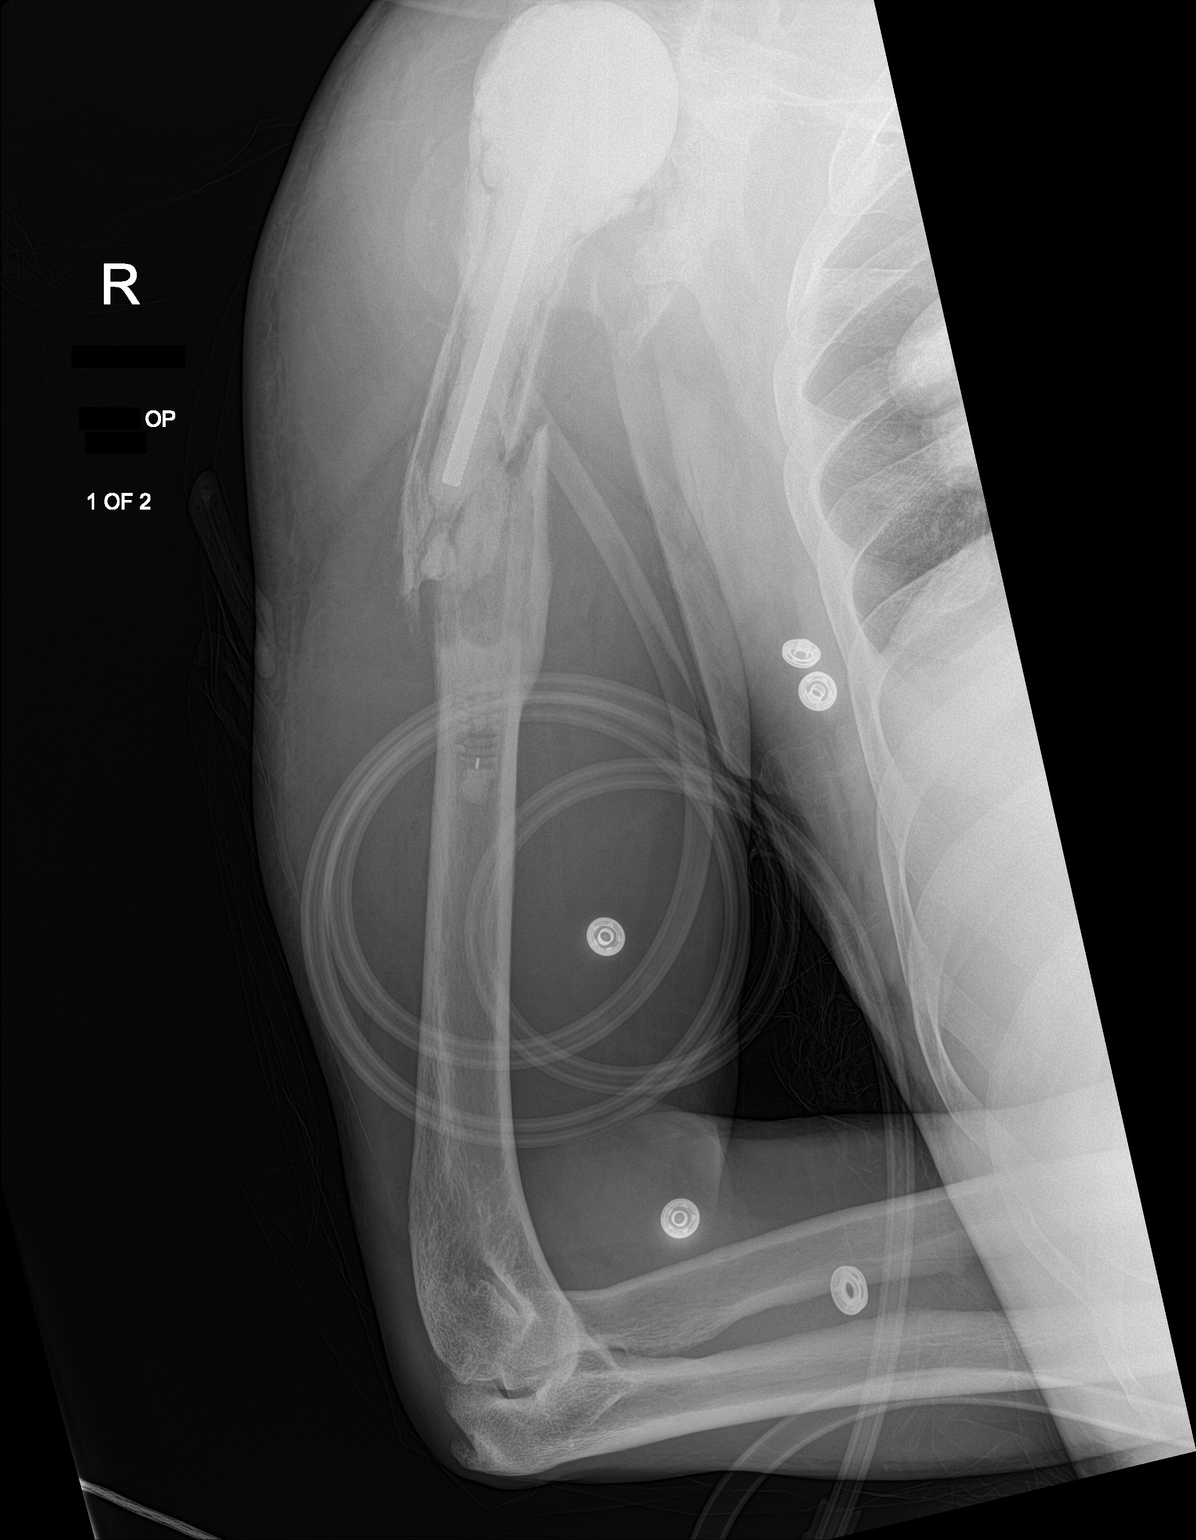

[2 of 2 positions shown; findings below may reference images not displayed]

FINDINGS: Fracture of the mid humeral shaft, distal to the proximal humerus
prosthesis. Displacement of distal fracture fragment medially.
Degenerative changes about the acromioclavicular joint.
IMPRESSION: Postsurgical changes, as detailed above.

## 2023-12-12 NOTE — Patient Instructions (Signed)
 Name: Levi Turner   Date: 12/02/2023   OT ACTION PLAN: Functional Mobility   Target Problem Area:   Decreased functional mobility getting up and down from toilet    Why Problem May Occur:    Pain  No hand rails  Target Goal(s):   Safety and independence getting up and down from master bath toilet    STRATEGIES   Modifying your home environment and making it safe     DO:  A grab bar on the left as you are sitting on the toilet    PRACTICE  Based on what we have talked about, you are willing to try:   Use the grab bar next to the toilet to help you get up and down as needed.    If an idea does not work the first time, try it again (and again).  We may make some changes over the next few sessions, based on how they work.   [name]        12/02/2023 Occupational Therapist                        Date

## 2023-12-12 NOTE — Patient Outreach (Signed)
 Aging Gracefully Program  OT FINAL Visit  12/12/2023  Levi Turner 1951/03/19 [id number]  Visit:  4- Fourth Visit  Start Time:  1631 End Time:  1700 Total Minutes:  29  Readiness to Change:  Readiness to Change Score: 10  Patient Education: Tips for safety at home booklet. Verbalized understanding    Goals:  Goals Addressed               This Visit's Progress     COMPLETED: Patient Stated (pt-stated)        Safety in the master bathroom: Grab bar on to the left of the toilet as you are sitting on it. Possibly fix the small walk in shower to make it functional and if cannot please fix it so it does not leak. 12/02/2023 MET for grab bar. Shower was fixed so water  does not drip but not made functional.  Name: Levi Turner   Date: 12/02/2023   OT ACTION PLAN: Functional Mobility   Target Problem Area:   Decreased functional mobility getting up and down from toilet    Why Problem May Occur:    Pain  No hand rails  Target Goal(s):   Safety and independence getting up and down from master bath toilet    STRATEGIES   Modifying your home environment and making it safe     DO:  A grab bar on the left as you are sitting on the toilet    PRACTICE  Based on what we have talked about, you are willing to try:   Use the grab bar next to the toilet to help you get up and down as needed.    If an idea does not work the first time, try it again (and again).  We may make some changes over the next few sessions, based on how they work.   [name]        12/02/2023 Occupational Therapist                        Date        Post Clinical Reasoning: Mr. Cruces is happy with the AG program and all that [facility] has done for him and his wife. He is very Adult nurse,  Administrator, Civil Service, OT Aging Gracefully [phone]

## 2023-12-31 DIAGNOSIS — K5100 Ulcerative (chronic) pancolitis without complications: Secondary | ICD-10-CM | POA: Diagnosis not present

## 2024-01-07 DIAGNOSIS — K5100 Ulcerative (chronic) pancolitis without complications: Secondary | ICD-10-CM | POA: Diagnosis not present

## 2024-01-19 DIAGNOSIS — K5100 Ulcerative (chronic) pancolitis without complications: Secondary | ICD-10-CM | POA: Diagnosis not present

## 2024-02-17 ENCOUNTER — Other Ambulatory Visit: Payer: Self-pay

## 2024-02-17 NOTE — Patient Outreach (Signed)
 Aging Gracefully Program  02/17/2024  MACKSON BOTZ May 03, 1950 [id number]   [facility] Evaluation Interviewer made contact with patient. Aging Gracefully 5 month survey completed.     Shereen [facility]  Population Health Care Management Assistant  Direct Dial: [phone]  Fax: [phone] Website: [email or web address]

## 2024-03-07 DIAGNOSIS — K5100 Ulcerative (chronic) pancolitis without complications: Secondary | ICD-10-CM | POA: Diagnosis not present

## 2024-04-12 IMAGING — CT CT SHOULDER*R* W/O CM
3 of 4 series · 13 of 33 positions shown, 16 images · non-contrast
Comparison: None Available.

CLINICAL DATA: Evaluate for bone loss due to RT Shoulder Infection.
Hx abscess from Prosthesis infection.

EXAM:
CT OF THE UPPER RIGHT EXTREMITY WITHOUT CONTRAST
CT - 3-DIMENSIONAL CT IMAGE RENDERING ON INDEPENDENT WORKSTATION
TECHNIQUE: Multidetector CT imaging of the upper right extremity was performed
according to the standard protocol.

[Series 6: shoulder 2.00 br40 s3 cor soft · coronal · 0.53mm/px · 3 of 101 slices shown]
[im 21/101  bone]
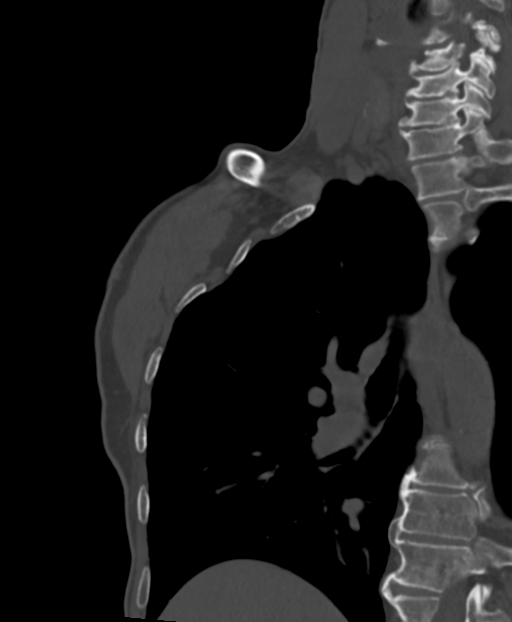
[im 41/101  bone]
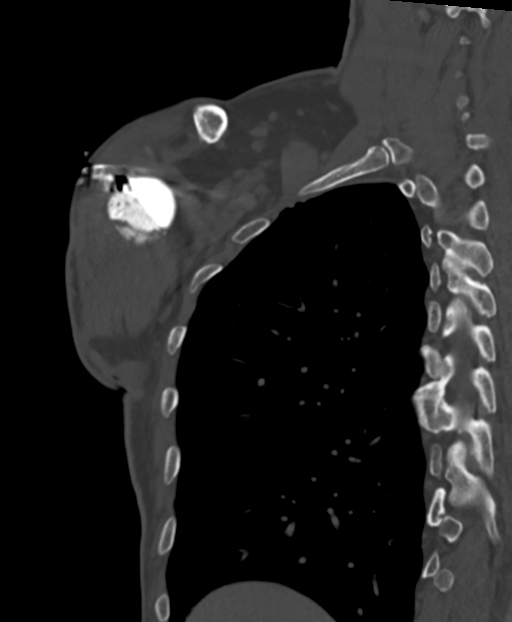
[im 61/101  bone]
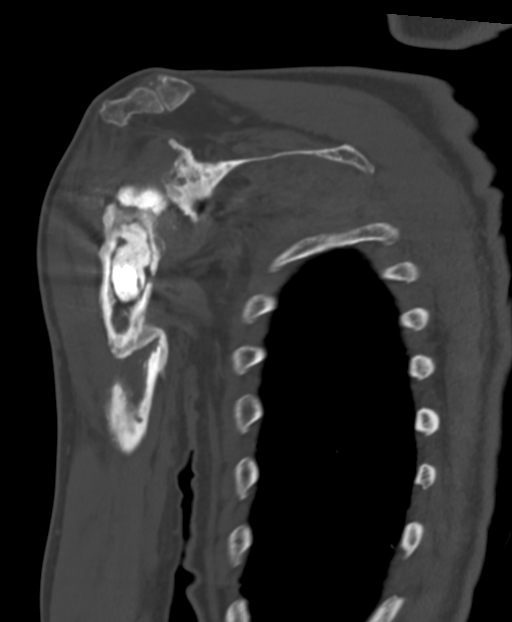

[Series 10: shoulder 2.00 br40 s3 sag soft · sagittal · 0.49mm/px · 5 of 138 slices shown, 6 images]
[im 46/138  bone]
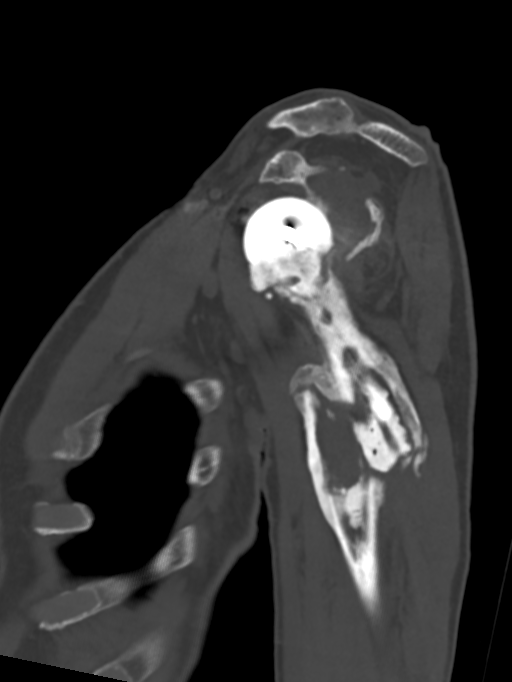
[im 58/138  bone]
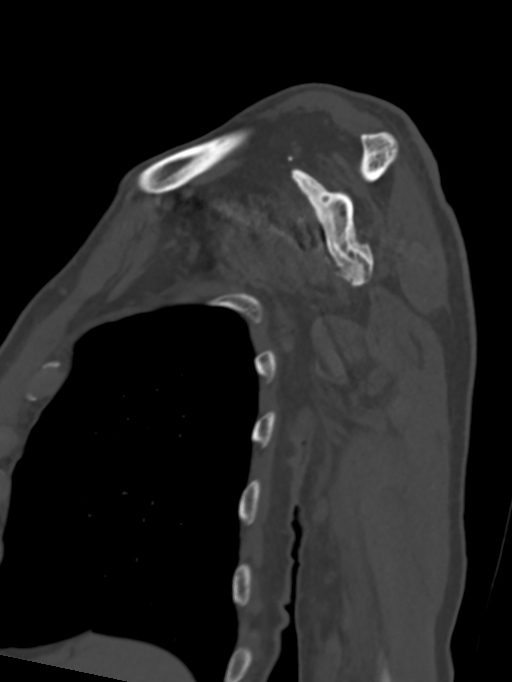
[im 69/138  soft-tissue]
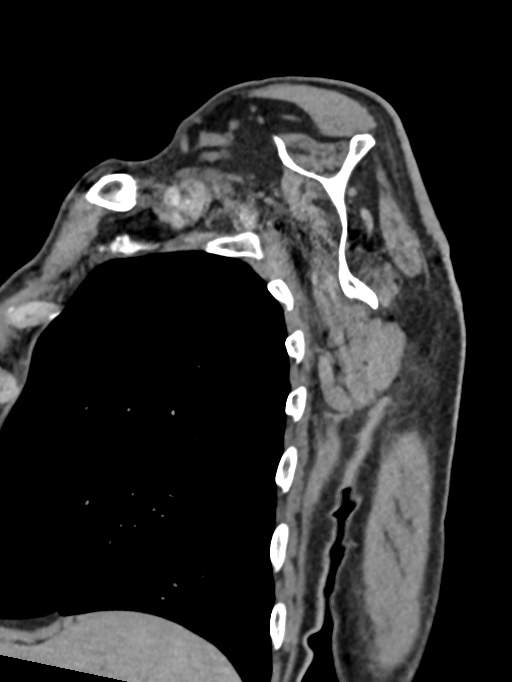
[im 69/138  bone]
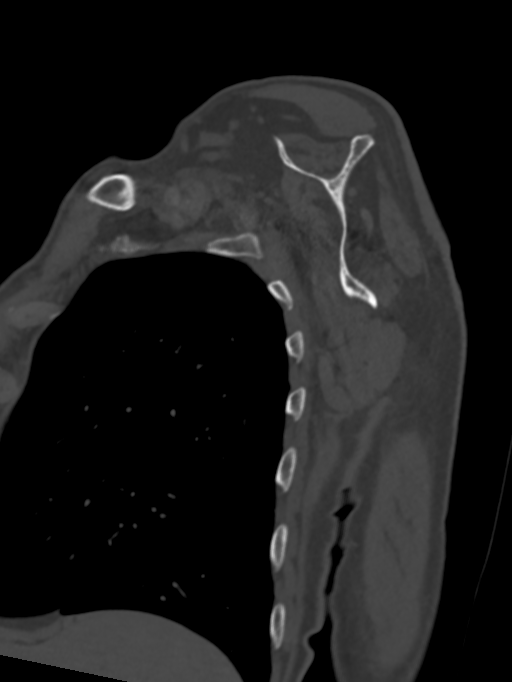
[im 80/138  bone]
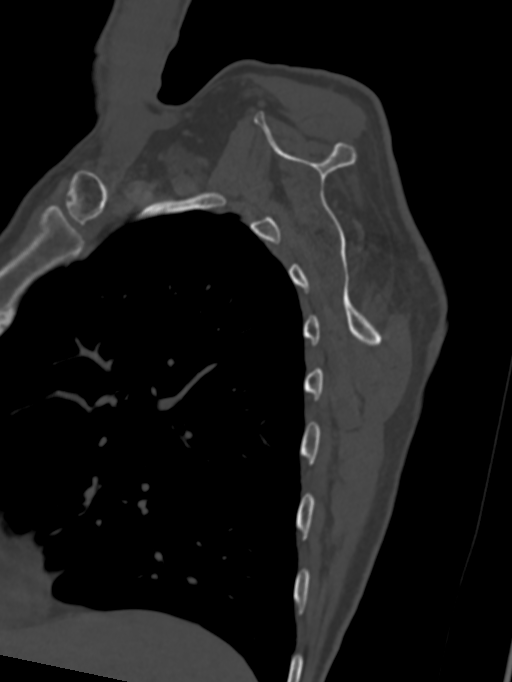
[im 92/138  bone]
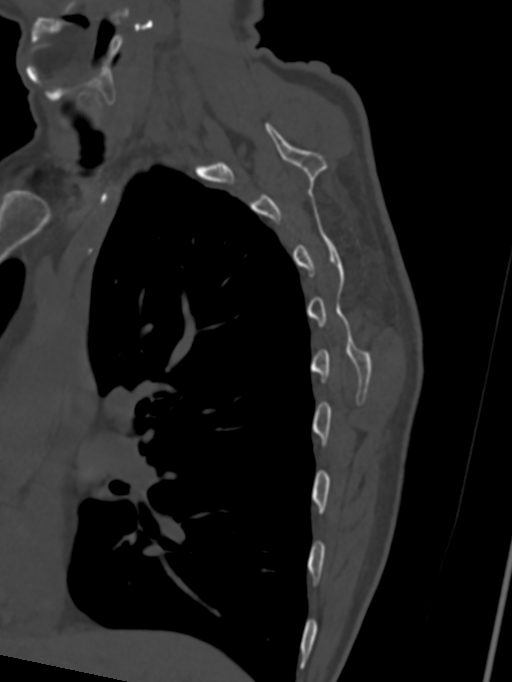

[Series 14: shoulder 0.60 br40 s3 thins soft · axial · 0.55mm/px · z∈[-1070,-799]mm · 5 of 566 slices shown, 7 images]
[im 57/566  soft-tissue]
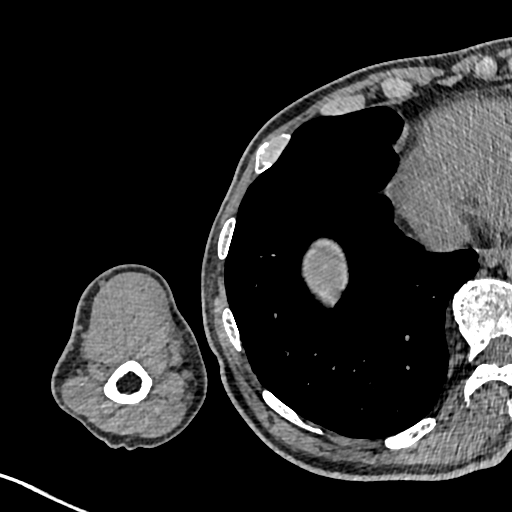
[im 57/566  bone]
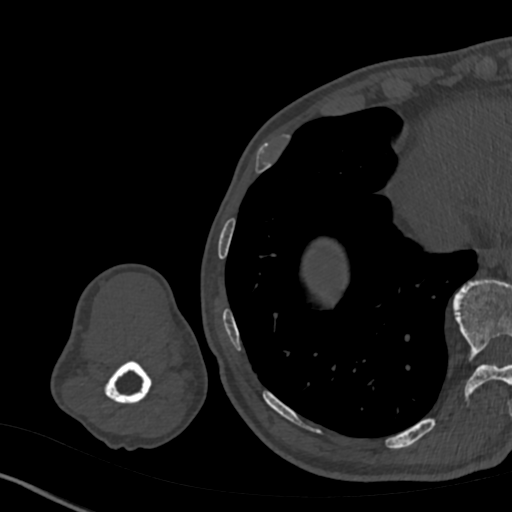
[im 170/566  bone]
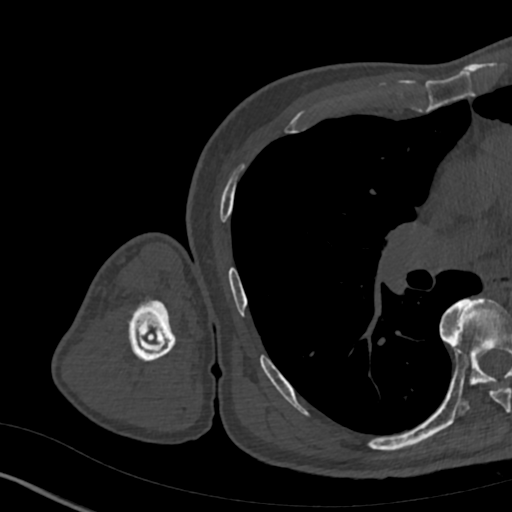
[im 283/566  bone]
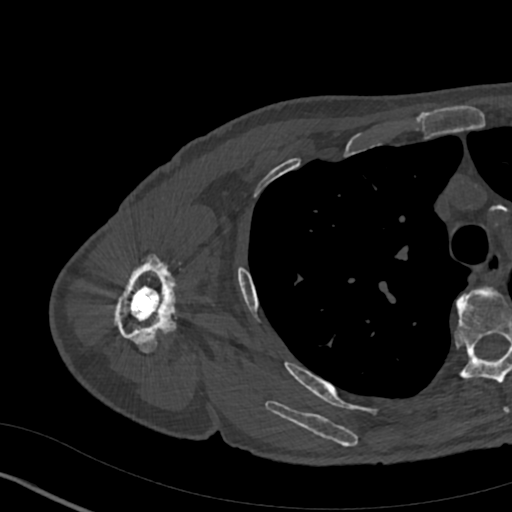
[im 396/566  bone]
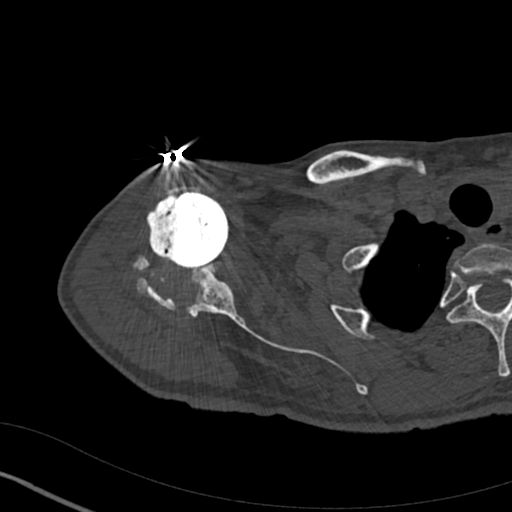
[im 509/566  soft-tissue]
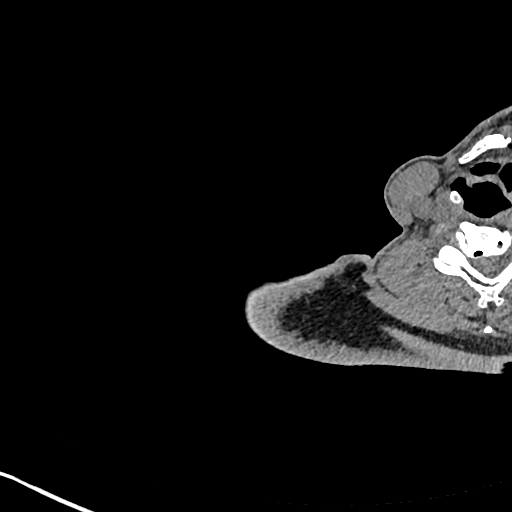
[im 509/566  bone]
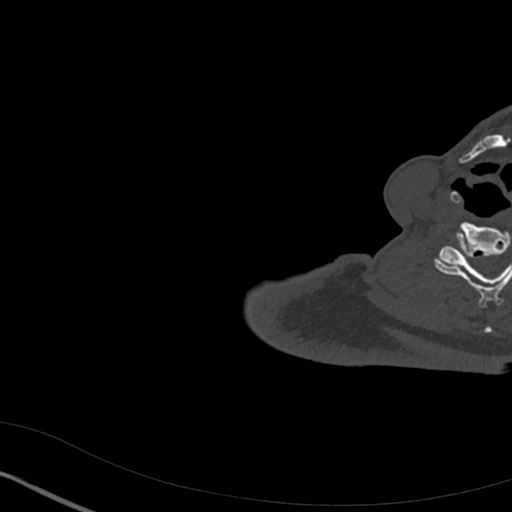

[13 of 33 positions shown; findings below may reference images not displayed]

CT - 3-DIMENSIONAL CT IMAGE RENDERING ON INDEPENDENT WORKSTATION

RADIATION DOSE REDUCTION: This exam was performed according to the
departmental dose-optimization program which includes automated
exposure control, adjustment of the mA and/or kV according to
patient size and/or use of iterative reconstruction technique.
FINDINGS: Bones/Joint/Cartilage

History of prior right shoulder arthroplasty with subsequent
removal. Placement of antibiotic laden methylmethacrylate in the
proximal humerus. Chronic cortical thickening of the proximal
humerus consistent with history of osteomyelitis. Fragmentation of
the glenoid with loss of bone stock and irregularity along the
articular surface. Anterior subluxation of the proximal humerus
relative to the glenoid.

Ununited fracture of the proximal humeral diaphysis with 14 mm of
anteromedial displacement. Moderate arthropathy of the
acromioclavicular joint.

Ligaments

Ligaments are suboptimally evaluated by CT.

Muscles and Tendons
No intramuscular fluid collection. Atrophy scratch them mild atrophy
of the supraspinatus muscle and subscapularis muscle. Severe atrophy
of the infraspinatus and teres minor muscles.

Soft tissue
No fluid collection or hematoma. No soft tissue mass. Visualized
right lung is clear. Thoracic aortic atherosclerosis. Multi vessel
coronary artery atherosclerosis.
IMPRESSION: 1. History of prior right shoulder arthroplasty with subsequent
removal. Placement of antibiotic laden methylmethacrylate in the
proximal humerus. Chronic cortical thickening of the proximal
humerus consistent with history of osteomyelitis. Residual infection
cannot be excluded on this examination.
2. Fragmentation of the glenoid with loss of bone stock and
irregularity along the articular surface. Anterior subluxation of
the proximal humerus relative to the glenoid.
3. Ununited fracture of the proximal humeral diaphysis with 14 mm of
anteromedial displacement.
4. Aortic Atherosclerosis ([id number]).
